# Patient Record
Sex: Male | Born: 1985 | Race: White | Hispanic: No | Marital: Single | Smoking: Never smoker
Health system: Southern US, Community
[De-identification: ages and names within clinical notes are randomized; demographics above are authoritative.]

## PROBLEM LIST (undated history)

## (undated) DIAGNOSIS — F419 Anxiety disorder, unspecified: Secondary | ICD-10-CM

## (undated) DIAGNOSIS — F909 Attention-deficit hyperactivity disorder, unspecified type: Secondary | ICD-10-CM

## (undated) HISTORY — DX: Attention-deficit hyperactivity disorder, unspecified type: F90.9

## (undated) HISTORY — PX: OTHER SURGICAL HISTORY: SHX169

## (undated) HISTORY — DX: Anxiety disorder, unspecified: F41.9

---
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
## 2008-09-08 ENCOUNTER — Ambulatory Visit: Payer: Self-pay

## 2012-06-18 ENCOUNTER — Emergency Department: Payer: Self-pay | Admitting: Emergency Medicine

## 2013-09-25 ENCOUNTER — Emergency Department: Payer: Self-pay | Admitting: Emergency Medicine

## 2013-09-25 LAB — BEHAVIORAL MEDICINE 1 PANEL
AST: 37 U/L (ref 15–37)
Albumin: 4.4 g/dL (ref 3.4–5.0)
Alkaline Phosphatase: 81 U/L
Anion Gap: 2 — ABNORMAL LOW (ref 7–16)
BASOS ABS: 0.1 10*3/uL (ref 0.0–0.1)
BUN: 13 mg/dL (ref 7–18)
Basophil %: 0.8 %
Bilirubin,Total: 0.6 mg/dL (ref 0.2–1.0)
CO2: 31 mmol/L (ref 21–32)
Calcium, Total: 9.7 mg/dL (ref 8.5–10.1)
Chloride: 103 mmol/L (ref 98–107)
Creatinine: 1.1 mg/dL (ref 0.60–1.30)
EOS ABS: 0.3 10*3/uL (ref 0.0–0.7)
EOS PCT: 5.7 %
Glucose: 75 mg/dL (ref 65–99)
HCT: 47.7 % (ref 40.0–52.0)
HGB: 17.1 g/dL (ref 13.0–18.0)
Lymphocyte #: 1.5 10*3/uL (ref 1.0–3.6)
Lymphocyte %: 25.5 %
MCH: 32 pg (ref 26.0–34.0)
MCHC: 35.8 g/dL (ref 32.0–36.0)
MCV: 89 fL (ref 80–100)
MONO ABS: 0.7 x10 3/mm (ref 0.2–1.0)
Monocyte %: 12 %
NEUTROS ABS: 3.4 10*3/uL (ref 1.4–6.5)
Neutrophil %: 56 %
Osmolality: 271 (ref 275–301)
PLATELETS: 158 10*3/uL (ref 150–440)
Potassium: 4.1 mmol/L (ref 3.5–5.1)
RBC: 5.34 10*6/uL (ref 4.40–5.90)
RDW: 12.7 % (ref 11.5–14.5)
SGPT (ALT): 32 U/L (ref 12–78)
Sodium: 136 mmol/L (ref 136–145)
Thyroid Stimulating Horm: 1.5 u[IU]/mL
Total Protein: 8.4 g/dL — ABNORMAL HIGH (ref 6.4–8.2)
WBC: 6 10*3/uL (ref 3.8–10.6)

## 2013-09-25 LAB — DRUG SCREEN, URINE
Amphetamines, Ur Screen: POSITIVE (ref ?–1000)
BARBITURATES, UR SCREEN: NEGATIVE (ref ?–200)
Benzodiazepine, Ur Scrn: NEGATIVE (ref ?–200)
Cannabinoid 50 Ng, Ur ~~LOC~~: NEGATIVE (ref ?–50)
Cocaine Metabolite,Ur ~~LOC~~: NEGATIVE (ref ?–300)
MDMA (Ecstasy)Ur Screen: NEGATIVE (ref ?–500)
Methadone, Ur Screen: NEGATIVE (ref ?–300)
OPIATE, UR SCREEN: NEGATIVE (ref ?–300)
Phencyclidine (PCP) Ur S: NEGATIVE (ref ?–25)
TRICYCLIC, UR SCREEN: NEGATIVE (ref ?–1000)

## 2015-04-11 ENCOUNTER — Encounter: Payer: Self-pay | Admitting: Family Medicine

## 2015-04-11 ENCOUNTER — Ambulatory Visit (INDEPENDENT_AMBULATORY_CARE_PROVIDER_SITE_OTHER): Payer: BLUE CROSS/BLUE SHIELD | Admitting: Family Medicine

## 2015-04-11 VITALS — BP 130/80 | HR 78 | Ht 71.0 in | Wt 203.0 lb

## 2015-04-11 DIAGNOSIS — F419 Anxiety disorder, unspecified: Secondary | ICD-10-CM | POA: Diagnosis not present

## 2015-04-11 DIAGNOSIS — F900 Attention-deficit hyperactivity disorder, predominantly inattentive type: Secondary | ICD-10-CM

## 2015-04-11 MED ORDER — SERTRALINE HCL 25 MG PO TABS: 25.0000 mg | ORAL_TABLET | Freq: Every day | ORAL | Status: DC

## 2015-04-11 NOTE — Progress Notes (Signed)
Name: David Graham   MRN: [id number]    DOB: Oct 02, 1985   Date:04/11/2015       Progress Note  Subjective  Chief Complaint  Chief Complaint  Patient presents with  . Depression    Anxiety Presents for follow-up visit. Onset was 1 to 6 months ago. The problem has been waxing and waning. Symptoms include depressed mood, excessive worry, irritability, nervous/anxious behavior and panic. Patient reports no chest pain, compulsions, confusion, decreased concentration, dizziness, dry mouth, feeling of choking, hyperventilation, impotence, insomnia, malaise, muscle tension, nausea, obsessions, palpitations, restlessness, shortness of breath or suicidal ideas. Symptoms occur occasionally. The severity of symptoms is mild. Nothing aggravates the symptoms.   His past medical history is significant for anxiety/panic attacks. There is no history of anemia, arrhythmia, asthma, bipolar disorder, CAD, CHF, chronic lung disease, depression, fibromyalgia, hyperthyroidism or suicide attempts. Past treatments include benzodiazephines. The treatment provided mild relief. Compliance with prior treatments has been good.    No problem-specific assessment & plan notes found for this encounter.   Past Medical History  Diagnosis Date  . Anxiety   . ADHD (attention deficit hyperactivity disorder)     History reviewed. No pertinent past surgical history.  History reviewed. No pertinent family history.  History   Social History  . Marital Status: Single    Spouse Name: N/A  . Number of Children: N/A  . Years of Education: N/A   Occupational History  . Not on file.   Social History Main Topics  . Smoking status: Never Smoker   . Smokeless tobacco: Current User    Types: Snuff     Comment: occassional  . Alcohol Use: 0.0 oz/week    0 Standard drinks or equivalent per week  . Drug Use: No  . Sexual Activity: Not Currently   Other Topics Concern  . Not on file   Social History Narrative  . No  narrative on file    No Known Allergies   Review of Systems  Constitutional: Positive for irritability.  Respiratory: Negative for shortness of breath.   Cardiovascular: Negative for chest pain and palpitations.  Gastrointestinal: Negative for nausea.  Genitourinary: Negative for impotence.  Neurological: Negative for dizziness.  Psychiatric/Behavioral: Negative for suicidal ideas, confusion and decreased concentration. The patient is nervous/anxious. The patient does not have insomnia.      Objective  Filed Vitals:   04/11/15 1359  BP: 130/80  Pulse: 78  Height:  (1.803 m)  Weight: 203 lb (92.08 kg)    Physical Exam    Assessment & Plan  Problem List Items Addressed This Visit    None    Visit Diagnoses    Acute anxiety    -  Primary    to contact behavior med for earlier followup    Relevant Medications    LORazepam (ATIVAN) 0.5 MG tablet    sertraline (ZOLOFT) 25 MG tablet    Attention deficit hyperactivity disorder (ADHD), predominantly inattentive type             Dr. Hayden [facility]  04/11/2015

## 2015-10-21 ENCOUNTER — Other Ambulatory Visit: Payer: Self-pay | Admitting: Family Medicine

## 2016-02-09 ENCOUNTER — Other Ambulatory Visit: Payer: Self-pay | Admitting: Family Medicine

## 2016-10-10 ENCOUNTER — Ambulatory Visit: Payer: BLUE CROSS/BLUE SHIELD | Admitting: Family Medicine

## 2017-02-27 ENCOUNTER — Encounter: Payer: Self-pay | Admitting: Family Medicine

## 2017-05-06 ENCOUNTER — Encounter: Payer: Self-pay | Admitting: Family Medicine

## 2017-08-29 ENCOUNTER — Encounter: Payer: Self-pay | Admitting: Family Medicine

## 2017-08-29 ENCOUNTER — Ambulatory Visit: Payer: 59 | Admitting: Family Medicine

## 2017-08-29 VITALS — BP 138/84 | HR 77 | Ht 71.0 in | Wt 208.0 lb

## 2017-08-29 DIAGNOSIS — F4323 Adjustment disorder with mixed anxiety and depressed mood: Secondary | ICD-10-CM

## 2017-08-29 DIAGNOSIS — R5383 Other fatigue: Secondary | ICD-10-CM

## 2017-08-29 DIAGNOSIS — R5381 Other malaise: Secondary | ICD-10-CM

## 2017-08-29 DIAGNOSIS — R030 Elevated blood-pressure reading, without diagnosis of hypertension: Secondary | ICD-10-CM

## 2017-08-29 NOTE — Patient Instructions (Signed)
DASH Eating Plan DASH stands for "Dietary Approaches to Stop Hypertension." The DASH eating plan is a healthy eating plan that has been shown to reduce high blood pressure (hypertension). It may also reduce your risk for type 2 diabetes, heart disease, and stroke. The DASH eating plan may also help with weight loss. What are tips for following this plan? General guidelines  Avoid eating more than 2,300 mg (milligrams) of salt (sodium) a day. If you have hypertension, you may need to reduce your sodium intake to 1,500 mg a day.  Limit alcohol intake to no more than 1 drink a day for nonpregnant women and 2 drinks a day for men. One drink equals 12 oz of beer, 5 oz of wine, or 1 oz of hard liquor.  Work with your health care provider to maintain a healthy body weight or to lose weight. Ask what an ideal weight is for you.  Get at least 30 minutes of exercise that causes your heart to beat faster (aerobic exercise) most days of the week. Activities may include walking, swimming, or biking.  Work with your health care provider or diet and nutrition specialist (dietitian) to adjust your eating plan to your individual calorie needs. Reading food labels  Check food labels for the amount of sodium per serving. Choose foods with less than 5 percent of the Daily Value of sodium. Generally, foods with less than 300 mg of sodium per serving fit into this eating plan.  To find whole grains, look for the word "whole" as the first word in the ingredient list. Shopping  Buy products labeled as "low-sodium" or "no salt added."  Buy fresh foods. Avoid canned foods and premade or frozen meals. Cooking  Avoid adding salt when cooking. Use salt-free seasonings or herbs instead of table salt or sea salt. Check with your health care provider or pharmacist before using salt substitutes.  Do not fry foods. Cook foods using healthy methods such as baking, boiling, grilling, and broiling instead.  Cook with  heart-healthy oils, such as olive, canola, soybean, or sunflower oil. Meal planning   Eat a balanced diet that includes: ? 5 or more servings of fruits and vegetables each day. At each meal, try to fill half of your plate with fruits and vegetables. ? Up to 6-8 servings of whole grains each day. ? Less than 6 oz of lean meat, poultry, or fish each day. A 3-oz serving of meat is about the same size as a deck of cards. One egg equals 1 oz. ? 2 servings of low-fat dairy each day. ? A serving of nuts, seeds, or beans 5 times each week. ? Heart-healthy fats. Healthy fats called Omega-3 fatty acids are found in foods such as flaxseeds and coldwater fish, like sardines, salmon, and mackerel.  Limit how much you eat of the following: ? Canned or prepackaged foods. ? Food that is high in trans fat, such as fried foods. ? Food that is high in saturated fat, such as fatty meat. ? Sweets, desserts, sugary drinks, and other foods with added sugar. ? Full-fat dairy products.  Do not salt foods before eating.  Try to eat at least 2 vegetarian meals each week.  Eat more home-cooked food and less restaurant, buffet, and fast food.  When eating at a restaurant, ask that your food be prepared with less salt or no salt, if possible. What foods are recommended? The items listed may not be a complete list. Talk with your dietitian about what   dietary choices are best for you. Grains Whole-grain or whole-wheat bread. Whole-grain or whole-wheat pasta. Brown rice. Oatmeal. Quinoa. Bulgur. Whole-grain and low-sodium cereals. Pita bread. Low-fat, low-sodium crackers. Whole-wheat flour tortillas. Vegetables Fresh or frozen vegetables (raw, steamed, roasted, or grilled). Low-sodium or reduced-sodium tomato and vegetable juice. Low-sodium or reduced-sodium tomato sauce and tomato paste. Low-sodium or reduced-sodium canned vegetables. Fruits All fresh, dried, or frozen fruit. Canned fruit in natural juice (without  added sugar). Meat and other protein foods Skinless chicken or turkey. Ground chicken or turkey. Pork with fat trimmed off. Fish and seafood. Egg whites. Dried beans, peas, or lentils. Unsalted nuts, nut butters, and seeds. Unsalted canned beans. Lean cuts of beef with fat trimmed off. Low-sodium, lean deli meat. Dairy Low-fat (1%) or fat-free (skim) milk. Fat-free, low-fat, or reduced-fat cheeses. Nonfat, low-sodium ricotta or cottage cheese. Low-fat or nonfat yogurt. Low-fat, low-sodium cheese. Fats and oils Soft margarine without trans fats. Vegetable oil. Low-fat, reduced-fat, or light mayonnaise and salad dressings (reduced-sodium). Canola, safflower, olive, soybean, and sunflower oils. Avocado. Seasoning and other foods Herbs. Spices. Seasoning mixes without salt. Unsalted popcorn and pretzels. Fat-free sweets. What foods are not recommended? The items listed may not be a complete list. Talk with your dietitian about what dietary choices are best for you. Grains Baked goods made with fat, such as croissants, muffins, or some breads. Dry pasta or rice meal packs. Vegetables Creamed or fried vegetables. Vegetables in a cheese sauce. Regular canned vegetables (not low-sodium or reduced-sodium). Regular canned tomato sauce and paste (not low-sodium or reduced-sodium). Regular tomato and vegetable juice (not low-sodium or reduced-sodium). Pickles. Olives. Fruits Canned fruit in a light or heavy syrup. Fried fruit. Fruit in cream or butter sauce. Meat and other protein foods Fatty cuts of meat. Ribs. Fried meat. Bacon. Sausage. Bologna and other processed lunch meats. Salami. Fatback. Hotdogs. Bratwurst. Salted nuts and seeds. Canned beans with added salt. Canned or smoked fish. Whole eggs or egg yolks. Chicken or turkey with skin. Dairy Whole or 2% milk, cream, and half-and-half. Whole or full-fat cream cheese. Whole-fat or sweetened yogurt. Full-fat cheese. Nondairy creamers. Whipped toppings.  Processed cheese and cheese spreads. Fats and oils Butter. Stick margarine. Lard. Shortening. Ghee. Bacon fat. Tropical oils, such as coconut, palm kernel, or palm oil. Seasoning and other foods Salted popcorn and pretzels. Onion salt, garlic salt, seasoned salt, table salt, and sea salt. Worcestershire sauce. Tartar sauce. Barbecue sauce. Teriyaki sauce. Soy sauce, including reduced-sodium. Steak sauce. Canned and packaged gravies. Fish sauce. Oyster sauce. Cocktail sauce. Horseradish that you find on the shelf. Ketchup. Mustard. Meat flavorings and tenderizers. Bouillon cubes. Hot sauce and Tabasco sauce. Premade or packaged marinades. Premade or packaged taco seasonings. Relishes. Regular salad dressings. Where to find more information:  National Heart, Lung, and Blood Institute: www.nhlbi.nih.gov  American Heart Association: www.heart.org Summary  The DASH eating plan is a healthy eating plan that has been shown to reduce high blood pressure (hypertension). It may also reduce your risk for type 2 diabetes, heart disease, and stroke.  With the DASH eating plan, you should limit salt (sodium) intake to 2,300 mg a day. If you have hypertension, you may need to reduce your sodium intake to 1,500 mg a day.  When on the DASH eating plan, aim to eat more fresh fruits and vegetables, whole grains, lean proteins, low-fat dairy, and heart-healthy fats.  Work with your health care provider or diet and nutrition specialist (dietitian) to adjust your eating plan to your individual   calorie needs. This information is not intended to replace advice given to you by your health care provider. Make sure you discuss any questions you have with your health care provider. Document Released: 08/23/2011 Document Revised: 08/27/2016 Document Reviewed: 08/27/2016 Elsevier Interactive Patient Education  2017 Elsevier Inc.  

## 2017-08-29 NOTE — Progress Notes (Signed)
Name: David Graham   MRN: [id number]    DOB: 10/20/1985   Date:08/29/2017       Progress Note  Subjective  Chief Complaint  Chief Complaint  Patient presents with  . Hypertension    Having dull headaches. Lots of stress- high bp reading at [employer] less than a week ago. At [employer] 170-100. Is having a lot of stress.      Patient "stay tired"   Hypertension  This is a new problem. The current episode started 1 to 4 weeks ago. The problem has been waxing and waning since onset. Pertinent negatives include no anxiety, blurred vision, chest pain, headaches, malaise/fatigue, neck pain, orthopnea, palpitations, peripheral edema, PND, shortness of breath or sweats. There are no associated agents to hypertension. There are no known risk factors for coronary artery disease. Past treatments include lifestyle changes. The current treatment provides no improvement. There are no compliance problems.  There is no history of angina, kidney disease, CAD/MI, CVA, heart failure, left ventricular hypertrophy, PVD or retinopathy. There is no history of chronic renal disease, a hypertension causing med or renovascular disease.    No problem-specific Assessment & Plan notes found for this encounter.   Past Medical History:  Diagnosis Date  . ADHD (attention deficit hyperactivity disorder)   . Anxiety     History reviewed. No pertinent surgical history.  History reviewed. No pertinent family history.  Social History   Socioeconomic History  . Marital status: Single    Spouse name: Not on file  . Number of children: Not on file  . Years of education: Not on file  . Highest education level: Not on file  Social Needs  . Financial resource strain: Not on file  . Food insecurity - worry: Not on file  . Food insecurity - inability: Not on file  . Transportation needs - medical: Not on file  . Transportation needs - non-medical: Not on file  Occupational History  . Not on file  Tobacco Use  .  Smoking status: Never Smoker  . Smokeless tobacco: Current User    Types: Snuff  . Tobacco comment: occassional  Substance and Sexual Activity  . Alcohol use: Yes    Alcohol/week: 0.0 oz  . Drug use: No  . Sexual activity: Not Currently  Other Topics Concern  . Not on file  Social History Narrative  . Not on file    No Known Allergies  Outpatient Medications Prior to Visit  Medication Sig Dispense Refill  . clonazePAM (KLONOPIN) 0.5 MG tablet Take 0.5 mg by mouth 2 (two) times daily as needed for anxiety.    . sertraline (ZOLOFT) 25 MG tablet TAKE ONE TABLET BY MOUTH ONCE DAILY 15 tablet 0  . amphetamine-dextroamphetamine (ADDERALL XR) 30 MG 24 hr capsule Take 30 mg by mouth daily. Dr Jennelle Humanottle    . LORazepam (ATIVAN) 0.5 MG tablet Take 0.5 mg by mouth every 8 (eight) hours. Dr Jennelle Humanottle     No facility-administered medications prior to visit.     Review of Systems  Constitutional: Negative for malaise/fatigue.  Eyes: Negative for blurred vision.  Respiratory: Negative for shortness of breath.   Cardiovascular: Negative for chest pain, palpitations, orthopnea and PND.  Musculoskeletal: Negative for neck pain.  Neurological: Negative for headaches.     Objective  Vitals:   08/29/17 1346  BP: 138/84  Pulse: 77  SpO2: 100%  Weight: 208 lb (94.3 kg)  Height: 5\' 11"  (1.803 m)    Physical Exam  Constitutional: He is oriented to person, place, and time and well-developed, well-nourished, and in no distress.  HENT:  Head: Normocephalic.  Right Ear: External ear normal.  Left Ear: External ear normal.  Nose: Nose normal.  Mouth/Throat: Oropharynx is clear and moist.  Eyes: Conjunctivae and EOM are normal. Pupils are equal, round, and reactive to light. Right eye exhibits no discharge. Left eye exhibits no discharge. No scleral icterus.  Neck: Normal range of motion. Neck supple. No JVD present. No tracheal deviation present. No thyromegaly present.  Cardiovascular: Normal  rate, regular rhythm, normal heart sounds and intact distal pulses. Exam reveals no gallop and no friction rub.  No murmur heard. Pulmonary/Chest: Breath sounds normal. No respiratory distress. He has no wheezes. He has no rales.  Abdominal: Soft. Bowel sounds are normal. He exhibits no mass. There is no hepatosplenomegaly. There is no tenderness. There is no rebound, no guarding and no CVA tenderness.  Musculoskeletal: Normal range of motion. He exhibits no edema or tenderness.  Lymphadenopathy:    He has no cervical adenopathy.  Neurological: He is alert and oriented to person, place, and time. He has normal sensation, normal strength, normal reflexes and intact cranial nerves. No cranial nerve deficit.  Skin: Skin is warm. No rash noted.  Psychiatric: Mood and affect normal.  Nursing note and vitals reviewed.     Assessment & Plan  Problem List Items Addressed This Visit    None    Visit Diagnoses    Elevated blood pressure reading without diagnosis of hypertension    -  Primary   Relevant Orders   Renal function panel   Malaise and fatigue       Relevant Orders   Renal function panel   TSH   Adjustment reaction with anxiety and depression          No orders of the defined types were placed in this encounter.     Dr. Hayden Rasmusseneanna Idella Lamontagne [facility]  08/29/17

## 2017-08-30 LAB — TSH: TSH: 1.68 u[IU]/mL (ref 0.450–4.500)

## 2017-08-30 LAB — RENAL FUNCTION PANEL
ALBUMIN: 4.9 g/dL (ref 3.5–5.5)
BUN/Creatinine Ratio: 10 (ref 9–20)
BUN: 12 mg/dL (ref 6–20)
CHLORIDE: 101 mmol/L (ref 96–106)
CO2: 26 mmol/L (ref 20–29)
Calcium: 9.9 mg/dL (ref 8.7–10.2)
Creatinine, Ser: 1.19 mg/dL (ref 0.76–1.27)
GFR, EST AFRICAN AMERICAN: 94 mL/min/{1.73_m2} (ref >59)
GFR, EST NON AFRICAN AMERICAN: 81 mL/min/{1.73_m2} (ref >59)
Glucose: 73 mg/dL (ref 65–99)
PHOSPHORUS: 4.3 mg/dL (ref 2.5–4.5)
Potassium: 4.3 mmol/L (ref 3.5–5.2)
Sodium: 142 mmol/L (ref 134–144)

## 2018-07-10 ENCOUNTER — Telehealth: Payer: Self-pay

## 2018-07-10 NOTE — Telephone Encounter (Signed)
LM to call us and schedule Follow Up for BP and Gaps in care BEFORE 09/10/2018 (See KPN ).

## 2018-08-03 ENCOUNTER — Encounter: Payer: Self-pay | Admitting: Emergency Medicine

## 2018-08-03 DIAGNOSIS — F39 Unspecified mood [affective] disorder: Secondary | ICD-10-CM | POA: Insufficient documentation

## 2018-08-03 DIAGNOSIS — F410 Panic disorder [episodic paroxysmal anxiety] without agoraphobia: Secondary | ICD-10-CM

## 2018-08-18 ENCOUNTER — Encounter: Payer: Self-pay | Admitting: Family Medicine

## 2018-08-19 ENCOUNTER — Encounter: Payer: Self-pay | Admitting: Psychiatry

## 2018-08-19 ENCOUNTER — Ambulatory Visit: Payer: 59 | Admitting: Psychiatry

## 2018-08-19 DIAGNOSIS — F4001 Agoraphobia with panic disorder: Secondary | ICD-10-CM | POA: Diagnosis not present

## 2018-08-19 DIAGNOSIS — F39 Unspecified mood [affective] disorder: Secondary | ICD-10-CM | POA: Diagnosis not present

## 2018-08-19 MED ORDER — CLONAZEPAM 0.5 MG PO TABS: 0.5000 mg | ORAL_TABLET | Freq: Two times a day (BID) | ORAL | 1 refills | Status: DC | PRN

## 2018-08-19 NOTE — Progress Notes (Signed)
David Graham [id number] 1986-07-22 32 y.o.  Subjective:   Patient ID:  David Graham is a 32 y.o. (DOB 1986-07-22) male.  Chief Complaint:  Chief Complaint  Patient presents with  . Anxiety  . Sleeping Problem  . Depression    HPI David Graham presents to the office today for follow-up of anxiety and sleep and recently more depressed.  Pt reports that mood is Anxious and Depressed and describes anxiety as Moderate. Anxiety symptoms include: Excessive Worry, not happy with personal life and work not good either.  Little stuff bothers him more.. Pt reports no sleep issues.  Too tired on doxepin but slept better.  Got a lot on his mind but not suffering from insomnia.  Pt reports that appetite is good. Pt reports that energy is good and loss of interest or pleasure in usual activities and poor motivation. Concentration is down slightly. Suicidal thoughts:  denied by patient.  More irritable and frutstrated with things.  Wants to avoid work.  Frustrated with dating life and being lonely and it never seems to make a difference.   Past psych med intolerances : Fluoxetine, Lexapro caused GI side effects, sertraline Trileptal caused a rash, Viibryd, Depakote.  Hx Adderall abuse.  Wellbutrin worst of all.  Review of Systems:  Review of Systems  Neurological: Negative for tremors and weakness.  Psychiatric/Behavioral: Positive for dysphoric mood. Negative for agitation, behavioral problems, confusion, decreased concentration, hallucinations, self-injury, sleep disturbance and suicidal ideas. The patient is nervous/anxious. The patient is not hyperactive.     Medications: I have reviewed the patient's current medications.  Current Outpatient Medications  Medication Sig Dispense Refill  . clonazePAM (KLONOPIN) 0.5 MG tablet Take 0.5 mg by mouth 2 (two) times daily as needed for anxiety.    Marland Kitchen. doxepin (SINEQUAN) 10 MG capsule Take 10 mg by mouth at bedtime. QHS X 7 DAYS, THEN 2 QHS    .  sertraline (ZOLOFT) 25 MG tablet TAKE ONE TABLET BY MOUTH ONCE DAILY (Patient not taking: Reported on 08/19/2018) 15 tablet 0   No current facility-administered medications for this visit.     Medication Side Effects: None Tends to have a lot of SE from meds.  Med sensitive.  Allergies:  Allergies  Allergen Reactions  . Oxcarbazepine   . Viibryd [Vilazodone Hcl]     Past Medical History:  Diagnosis Date  . ADHD (attention deficit hyperactivity disorder)   . Anxiety     History reviewed. No pertinent family history.  Social History   Socioeconomic History  . Marital status: Single    Spouse name: Not on file  . Number of children: Not on file  . Years of education: Not on file  . Highest education level: Not on file  Occupational History  . Not on file  Social Needs  . Financial resource strain: Not on file  . Food insecurity:    Worry: Not on file    Inability: Not on file  . Transportation needs:    Medical: Not on file    Non-medical: Not on file  Tobacco Use  . Smoking status: Never Smoker  . Smokeless tobacco: Current User    Types: Snuff  . Tobacco comment: occassional  Substance and Sexual Activity  . Alcohol use: Yes    Alcohol/week: 0.0 standard drinks  . Drug use: No  . Sexual activity: Not Currently  Lifestyle  . Physical activity:    Days per week: Not on file    Minutes  per session: Not on file  . Stress: Not on file  Relationships  . Social connections:    Talks on phone: Not on file    Gets together: Not on file    Attends religious service: Not on file    Active member of club or organization: Not on file    Attends meetings of clubs or organizations: Not on file    Relationship status: Not on file  . Intimate partner violence:    Fear of current or ex partner: Not on file    Emotionally abused: Not on file    Physically abused: Not on file    Forced sexual activity: Not on file  Other Topics Concern  . Not on file  Social History  Narrative  . Not on file    Past Medical History, Surgical history, Social history, and Family history were reviewed and updated as appropriate.   Please see review of systems for further details on the patient's review from today.   Objective:   Physical Exam:  There were no vitals taken for this visit.  Physical Exam  Constitutional: He is oriented to person, place, and time. He appears well-developed. No distress.  Musculoskeletal: He exhibits no deformity.  Neurological: He is alert and oriented to person, place, and time. He displays no tremor. Coordination and gait normal.  Psychiatric: He has a normal mood and affect. His speech is normal and behavior is normal. Judgment and thought content normal. His mood appears not anxious. His affect is not angry, not blunt, not labile and not inappropriate. Cognition and memory are normal. He does not exhibit a depressed mood. He expresses no homicidal and no suicidal ideation. He expresses no suicidal plans and no homicidal plans.  Insight intact. Dysphoric. No auditory or visual hallucinations. No delusions.  He is attentive.    Lab Review:     Component Value Date/Time   NA 142 08/29/2017 1444   NA 136 09/25/2013 1151   K 4.3 08/29/2017 1444   K 4.1 09/25/2013 1151   CL 101 08/29/2017 1444   CL 103 09/25/2013 1151   CO2 26 08/29/2017 1444   CO2 31 09/25/2013 1151   GLUCOSE 73 08/29/2017 1444   GLUCOSE 75 09/25/2013 1151   BUN 12 08/29/2017 1444   BUN 13 09/25/2013 1151   CREATININE 1.19 08/29/2017 1444   CREATININE 1.10 09/25/2013 1151   CALCIUM 9.9 08/29/2017 1444   CALCIUM 9.7 09/25/2013 1151   PROT 8.4 (H) 09/25/2013 1151   ALBUMIN 4.9 08/29/2017 1444   ALBUMIN 4.4 09/25/2013 1151   AST 37 09/25/2013 1151   ALT 32 09/25/2013 1151   ALKPHOS 81 09/25/2013 1151   BILITOT 0.6 09/25/2013 1151   GFRNONAA 81 08/29/2017 1444   GFRNONAA >60 09/25/2013 1151   GFRAA 94 08/29/2017 1444   GFRAA >60 09/25/2013 1151        Component Value Date/Time   WBC 6.0 09/25/2013 1151   RBC 5.34 09/25/2013 1151   HGB 17.1 09/25/2013 1151   HCT 47.7 09/25/2013 1151   PLT 158 09/25/2013 1151   MCV 89 09/25/2013 1151   MCH 32.0 09/25/2013 1151   MCHC 35.8 09/25/2013 1151   RDW 12.7 09/25/2013 1151   LYMPHSABS 1.5 09/25/2013 1151   MONOABS 0.7 09/25/2013 1151   EOSABS 0.3 09/25/2013 1151   BASOSABS 0.1 09/25/2013 1151    No results found for: POCLITH, LITHIUM   No results found for: PHENYTOIN, PHENOBARB, VALPROATE, CBMZ   .res Assessment:  Plan:    Panic disorder with agoraphobia  Episodic mood disorder (HCC)  Hx Adderall Abuse in remission  One type you've not taken is lithium even though not bipolar.  Low dosage option.  He doesn't want any change in meds.  We discussed the short-term risks associated with benzodiazepines including sedation and increased fall risk among others.  Discussed long-term side effect risk including dependence, potential withdrawal symptoms, and the potential eventual dose-related risk of dementia.   Problems with work but doesn't want to work elsewhere.  Tried dating sites without success.  I'm on th picky side.  Please see After Visit Summary for patient specific instructions.  This appointment was 15 minutes  Meredith Staggers, MD, DFAPA   No future appointments.  No orders of the defined types were placed in this encounter.     -------------------------------

## 2018-08-26 ENCOUNTER — Other Ambulatory Visit: Payer: Self-pay | Admitting: Psychiatry

## 2018-11-07 ENCOUNTER — Other Ambulatory Visit: Payer: Self-pay | Admitting: Psychiatry

## 2018-11-07 ENCOUNTER — Telehealth: Payer: Self-pay | Admitting: Psychiatry

## 2018-11-07 MED ORDER — SERTRALINE HCL 20 MG/ML PO CONC: 20.0000 mg | Freq: Every day | ORAL | 0 refills | Status: DC

## 2018-11-07 NOTE — Telephone Encounter (Signed)
Patient has taken sertraline suspension 20 mg/mL but only a drop or 2 at a time.  In 2018 August he said that it was helpful but reduced his energy and he had mild sexual side effects.  He also complained planed of mild mental cloudiness.  However he is wanting to take the medication again.  He is extremely medication sensitive.  But over time some of these patients become a little more tolerant as they take the medication.  We will call in a prescription that says 1 mL/day but he should restart at a low dose of a few drops per day as he has done in the past.  If he needs further instructions let us know.  Prescription was sent in to [facility] [place].  Meredith Staggers, MD, DFAPA

## 2018-11-07 NOTE — Telephone Encounter (Signed)
Patient has called and said that he does want to take the zoloft liquid drops. [facility] [place]. He was just here but if he needs to come in he will. Please let him know [phone].

## 2018-11-07 NOTE — Telephone Encounter (Signed)
Pt aware and verbalized understanding. Instructed to call back with any questions or concerns.

## 2019-01-05 ENCOUNTER — Other Ambulatory Visit: Payer: Self-pay | Admitting: Psychiatry

## 2019-01-05 ENCOUNTER — Telehealth: Payer: Self-pay | Admitting: Psychiatry

## 2019-01-05 NOTE — Telephone Encounter (Signed)
Patient called and said that the klonopin is no longer working. His anxiety is off the chart. He doesn;t know whether to change the dosage of klonopin or change medicine all together. [facility] [place]. Please call him asap since he is not doing well at [phone]

## 2019-01-05 NOTE — Telephone Encounter (Signed)
He has been on the sertraline solution 20 mg/mL at 1 mL a day for about 8 weeks now.  I understand he is medication sensitive but the longer he takes the medicine the better he will tend to tolerate it and it will help if he goes up in the dosage slightly to 1.2 mL's daily.  In addition to that he can increase the clonazepam by 50% to 0.5 mg 1 tablet 3 times a day.  That should probably provide a noticeable change.  He probably should also move his appointment up with me.

## 2019-01-06 ENCOUNTER — Other Ambulatory Visit: Payer: Self-pay | Admitting: Psychiatry

## 2019-01-06 ENCOUNTER — Telehealth: Payer: Self-pay | Admitting: Psychiatry

## 2019-01-06 NOTE — Telephone Encounter (Signed)
Pt given information, needs to discuss with Dr. Jennelle Human. Scheduled tomorrow 04/22

## 2019-01-06 NOTE — Telephone Encounter (Signed)
Left pt. A VM on his personal phone.

## 2019-01-06 NOTE — Telephone Encounter (Signed)
Please review

## 2019-01-06 NOTE — Telephone Encounter (Signed)
Patient would like to stay on regiman for Colonapin and Serlatine drops he is due for a refill for Colonapin on tomorrow.   Patient on schedule with Dr. Jennelle Human at 2:30 tomorrow

## 2019-01-06 NOTE — Telephone Encounter (Signed)
Wait till I see him tomorrow.  No refill today except I did send in the sertraline

## 2019-01-07 ENCOUNTER — Ambulatory Visit (INDEPENDENT_AMBULATORY_CARE_PROVIDER_SITE_OTHER): Payer: 59 | Admitting: Psychiatry

## 2019-01-07 ENCOUNTER — Other Ambulatory Visit: Payer: Self-pay

## 2019-01-07 ENCOUNTER — Encounter: Payer: Self-pay | Admitting: Psychiatry

## 2019-01-07 DIAGNOSIS — Z8659 Personal history of other mental and behavioral disorders: Secondary | ICD-10-CM | POA: Diagnosis not present

## 2019-01-07 DIAGNOSIS — F4001 Agoraphobia with panic disorder: Secondary | ICD-10-CM

## 2019-01-07 DIAGNOSIS — F411 Generalized anxiety disorder: Secondary | ICD-10-CM | POA: Diagnosis not present

## 2019-01-07 DIAGNOSIS — F15929 Other stimulant use, unspecified with intoxication, unspecified: Secondary | ICD-10-CM | POA: Diagnosis not present

## 2019-01-07 MED ORDER — LORAZEPAM 1 MG PO TABS: 1.0000 mg | ORAL_TABLET | Freq: Four times a day (QID) | ORAL | 0 refills | Status: DC

## 2019-01-07 NOTE — Progress Notes (Signed)
David Graham [id number] Jun 19, 1986 33 y.o.   Virtual Visit via Telephone Note  I connected with pt by telephone and verified that I am speaking with the correct person using two identifiers.   I discussed the limitations, risks, security and privacy concerns of performing an evaluation and management service by telephone and the availability of in person appointments. I also discussed with the patient that there may be a patient responsible charge related to this service. The patient expressed understanding and agreed to proceed.  I discussed the assessment and treatment plan with the patient. The patient was provided an opportunity to ask questions and all were answered. The patient agreed with the plan and demonstrated an understanding of the instructions.   The patient was advised to call back or seek an in-person evaluation if the symptoms worsen or if the condition fails to improve as anticipated.  I provided 36 minutes of non-face-to-face time during this encounter. The call started at 218 and ended at 35. The patient was located at home and the provider was located also office.   Subjective:   Patient ID:  David Graham is a 33 y.o. (DOB [birth year]) male.  Chief Complaint:  Chief Complaint  Patient presents with  . Anxiety    Urgent appointment due to severe intolerable anxiety  . Follow-up    Medication management  . Medication Problem    Very medication sensitive and fearful of meds    HPI  David Graham  requested urgent appointment for intolerable anxiety.  He called over the last couple of days wanting med changes and the clonazepam was increased to 0.5 3 times daily from twice daily.  It was encouraged for him to increase the sertraline but he was reluctant to do so.  2-3 weeks worsening anxiety, trouble sleeping, lower appetite, lost 10#.  Meds not working.  Wants relief.  No known pcpt.  Started taking a few more drops of sertraline to see if it would help.  It  has been this bad before also.  Reason he hasn't taken much sertraline is sexual se and cloudiness.  Had tried fluoxetine and didn't like it bc made him feel weird.  I feel like I'm immune to clonazepam.  Increase in clonazepam to TID helped a little.  Not taking doxepin bc hangover. Has taken sertraline solution off and on 4-6 drops on occasion.  On verge of panic. Dating a new girl and that's generating anxiety.  Anxiety all day long.  Admits to drinking or taking too much caffeine.  He uses it as a pre-workout but then will also drink it at night.  He has energy drinks in the daytime.  He is not even sure how much she is drinking but says it is a lot.  Dating a new girl but does not think that is the total cause of his anxiety.  Past psych med intolerances : Fluoxetine, Lexapro caused GI side effects, sertraline,  Trileptal caused a rash, Viibryd, Depakote.  Hx Adderall abuse.  Wellbutrin worst of all.  Xanax and Ativan.  Review of Systems:  Review of Systems  Neurological: Negative for tremors and weakness.  Psychiatric/Behavioral: Positive for dysphoric mood. Negative for agitation, behavioral problems, confusion, decreased concentration, hallucinations, self-injury, sleep disturbance and suicidal ideas. The patient is nervous/anxious. The patient is not hyperactive.     Medications: I have reviewed the patient's current medications.  Current Outpatient Medications  Medication Sig Dispense Refill  . clonazePAM (KLONOPIN) 0.5 MG tablet Take  1 tablet (0.5 mg total) by mouth 2 (two) times daily as needed for anxiety. (Patient taking differently: Take 0.5 mg by mouth 3 (three) times daily as needed for anxiety. ) 180 tablet 1  . LORazepam (ATIVAN) 1 MG tablet Take 1 tablet (1 mg total) by mouth 4 (four) times daily. 120 tablet 0  . sertraline (ZOLOFT) 20 MG/ML concentrated solution Take 1.5 mLs (30 mg total) by mouth daily. 45 mL 0  . sertraline (ZOLOFT) 25 MG tablet TAKE ONE TABLET BY MOUTH  ONCE DAILY (Patient not taking: Reported on 08/19/2018) 15 tablet 0   No current facility-administered medications for this visit.     Medication Side Effects: None Tends to have a lot of SE from meds.  Med sensitive.  Allergies:  Allergies  Allergen Reactions  . Oxcarbazepine   . Viibryd [Vilazodone Hcl]     Past Medical History:  Diagnosis Date  . ADHD (attention deficit hyperactivity disorder)   . Anxiety     No family history on file.  Social History   Socioeconomic History  . Marital status: Single    Spouse name: Not on file  . Number of children: Not on file  . Years of education: Not on file  . Highest education level: Not on file  Occupational History  . Not on file  Social Needs  . Financial resource strain: Not on file  . Food insecurity:    Worry: Not on file    Inability: Not on file  . Transportation needs:    Medical: Not on file    Non-medical: Not on file  Tobacco Use  . Smoking status: Never Smoker  . Smokeless tobacco: Current User    Types: Snuff  . Tobacco comment: occassional  Substance and Sexual Activity  . Alcohol use: Yes    Alcohol/week: 0.0 standard drinks  . Drug use: No  . Sexual activity: Not Currently  Lifestyle  . Physical activity:    Days per week: Not on file    Minutes per session: Not on file  . Stress: Not on file  Relationships  . Social connections:    Talks on phone: Not on file    Gets together: Not on file    Attends religious service: Not on file    Active member of club or organization: Not on file    Attends meetings of clubs or organizations: Not on file    Relationship status: Not on file  . Intimate partner violence:    Fear of current or ex partner: Not on file    Emotionally abused: Not on file    Physically abused: Not on file    Forced sexual activity: Not on file  Other Topics Concern  . Not on file  Social History Narrative  . Not on file    Past Medical History, Surgical history, Social  history, and Family history were reviewed and updated as appropriate.   Please see review of systems for further details on the patient's review from today.   Objective:   Physical Exam:  There were no vitals taken for this visit.  Physical Exam Neurological:     Mental Status: He is alert and oriented to person, place, and time.     Cranial Nerves: No dysarthria.  Psychiatric:        Attention and Perception: Attention normal.        Mood and Affect: Mood is anxious. Mood is not depressed.  Speech: Speech normal.        Behavior: Behavior is cooperative.        Thought Content: Thought content normal. Thought content is not paranoid or delusional. Thought content does not include homicidal or suicidal ideation. Thought content does not include homicidal or suicidal plan.        Cognition and Memory: Cognition and memory normal.     Comments: Severe anxiety.  Insight is fair and judgment fair.     Lab Review:     Component Value Date/Time   NA 142 08/29/2017 1444   NA 136 09/25/2013 1151   K 4.3 08/29/2017 1444   K 4.1 09/25/2013 1151   CL 101 08/29/2017 1444   CL 103 09/25/2013 1151   CO2 26 08/29/2017 1444   CO2 31 09/25/2013 1151   GLUCOSE 73 08/29/2017 1444   GLUCOSE 75 09/25/2013 1151   BUN 12 08/29/2017 1444   BUN 13 09/25/2013 1151   CREATININE 1.19 08/29/2017 1444   CREATININE 1.10 09/25/2013 1151   CALCIUM 9.9 08/29/2017 1444   CALCIUM 9.7 09/25/2013 1151   PROT 8.4 (H) 09/25/2013 1151   ALBUMIN 4.9 08/29/2017 1444   ALBUMIN 4.4 09/25/2013 1151   AST 37 09/25/2013 1151   ALT 32 09/25/2013 1151   ALKPHOS 81 09/25/2013 1151   BILITOT 0.6 09/25/2013 1151   GFRNONAA 81 08/29/2017 1444   GFRNONAA >60 09/25/2013 1151   GFRAA 94 08/29/2017 1444   GFRAA >60 09/25/2013 1151       Component Value Date/Time   WBC 6.0 09/25/2013 1151   RBC 5.34 09/25/2013 1151   HGB 17.1 09/25/2013 1151   HCT 47.7 09/25/2013 1151   PLT 158 09/25/2013 1151   MCV 89  09/25/2013 1151   MCH 32.0 09/25/2013 1151   MCHC 35.8 09/25/2013 1151   RDW 12.7 09/25/2013 1151   LYMPHSABS 1.5 09/25/2013 1151   MONOABS 0.7 09/25/2013 1151   EOSABS 0.3 09/25/2013 1151   BASOSABS 0.1 09/25/2013 1151    No results found for: POCLITH, LITHIUM   No results found for: PHENYTOIN, PHENOBARB, VALPROATE, CBMZ   .res Assessment: Plan:    Generalized anxiety disorder - Plan: LORazepam (ATIVAN) 1 MG tablet  Panic disorder with agoraphobia - Plan: LORazepam (ATIVAN) 1 MG tablet  History of major depression  Caffeine intoxication with complication (HCC)  Hx Adderall Abuse in remission  I am quite certain that a significant part of his acute worsening of anxiety is related to caffeine excess.  Patient's that have a history of panic typically are not very tolerant of caffeine and he admits to drinking or consuming too much caffeine.   Redcuce the caffeine by 20% daily until off .  Discussed caffeine withdrawal headaches.  We discussed the short-term risks associated with benzodiazepines including sedation and increased fall risk among others.  Discussed long-term side effect risk including tolerance, dependence, potential withdrawal symptoms, and the potential eventual dose-related risk of dementia.  Switch  To Ativan 1 mg QID.    Again discussed the various ways to prevent anxiety and SSRI are SSRIs are the most effective.  As noted he is tried several and prefers Zoloft.  He is willing to take it and stick with it for a while this time.  He is very med sensitive to SSRIs so we will need to use a low dose.  He has the sertraline solution 20 mg/mL get 1ml syringe , Start with .25ml  For 5-7 days, then 0.5 ml.  BC very med sensitive..  Disc SE in detail and be patient.  Goal remission.  Call if a problem.  He is med sensitive it is conceivable he might need an even lower dose and if he has a problem we can lower the dose and he is likely to still get benefit if he will be  consistent.  There is a history of compliance problems but no evidence of abuse of benzodiazepines.  He does have a past history of abusing Adderall.  Recommend counseling for anxiety.  Not done seriously before.  Will refer to Dr. Doran Heater.  Follow-up 4 weeks  Meredith Staggers, MD, DFAPA   Future Appointments  Date Time Provider Department Center  02/05/2019  3:00 PM Cottle, Steva Ready., MD [facility] None  02/17/2019  4:00 PM Cottle, Steva Ready., MD [facility] None    No orders of the defined types were placed in this encounter.     -------------------------------

## 2019-01-09 ENCOUNTER — Telehealth: Payer: Self-pay | Admitting: Psychiatry

## 2019-01-09 NOTE — Telephone Encounter (Signed)
Patient called and said that the ativan is not working and he wants to go back to the klonopin or try something different. Please call him at [phone] and [facility] [place]

## 2019-01-12 ENCOUNTER — Telehealth: Payer: Self-pay | Admitting: Psychiatry

## 2019-01-12 NOTE — Telephone Encounter (Signed)
Pt. Understands and will return Ativan at the end of this week. He would like to try the Risperidone.

## 2019-01-12 NOTE — Telephone Encounter (Signed)
I just gave him a month supply of Ativan last week.  He will need to bring the Ativan to the office before I will prescribe any more Klonopin.  If he has some Klonopin left he can go back to 3 daily.  I could prescribe something a little stronger like low dosage risperidone 0.5 mg he could take 1 at night that tends to be more helpful for obsessive worry than do meds like Klonopin or Ativan.  Eventually the sertraline will work if he will stick with it.  But it is also absolutely critical that he reduce the caffeine intake that he described to me last week.  Nothing will work if he does not reduce the caffeine.

## 2019-01-12 NOTE — Telephone Encounter (Signed)
Noted  

## 2019-01-12 NOTE — Telephone Encounter (Signed)
Pt called again. Stated he would like to stop Ativan. He feels out of it. Said he should have listen to you about increasing Klonopin to 3 in lieu of 2. Would like to try that now.

## 2019-01-12 NOTE — Telephone Encounter (Signed)
Pt aware and will return Ativan at the end of the week to the office.

## 2019-01-12 NOTE — Telephone Encounter (Signed)
David Graham called back and said he was going to try to cut the Ativan in half and see if that works.  He will try that for a week or so, if if doesn't work he'll bring it back and ask for something new.  He has appt 02/05/19 so wants to see if he can make it til then.

## 2019-01-16 ENCOUNTER — Telehealth: Payer: Self-pay | Admitting: Psychiatry
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
## 2019-01-19 ENCOUNTER — Telehealth: Payer: Self-pay | Admitting: Psychiatry

## 2019-01-19 NOTE — Telephone Encounter (Signed)
He calls to say that he is taking mother's Klonopin 0.5mg   And he is taking 1 1/2  Pills a day since he doesn't have his Ativan.

## 2019-01-30 ENCOUNTER — Ambulatory Visit (INDEPENDENT_AMBULATORY_CARE_PROVIDER_SITE_OTHER): Payer: 59 | Admitting: Psychiatry

## 2019-01-30 ENCOUNTER — Encounter: Payer: Self-pay | Admitting: Psychiatry

## 2019-01-30 ENCOUNTER — Other Ambulatory Visit: Payer: Self-pay

## 2019-01-30 DIAGNOSIS — F4001 Agoraphobia with panic disorder: Secondary | ICD-10-CM

## 2019-01-30 DIAGNOSIS — F411 Generalized anxiety disorder: Secondary | ICD-10-CM

## 2019-01-30 DIAGNOSIS — Z8659 Personal history of other mental and behavioral disorders: Secondary | ICD-10-CM | POA: Diagnosis not present

## 2019-01-30 DIAGNOSIS — F15929 Other stimulant use, unspecified with intoxication, unspecified: Secondary | ICD-10-CM

## 2019-01-30 MED ORDER — CLONAZEPAM 0.5 MG PO TABS: 0.5000 mg | ORAL_TABLET | Freq: Three times a day (TID) | ORAL | 1 refills | Status: DC | PRN

## 2019-01-30 MED ORDER — SERTRALINE HCL 20 MG/ML PO CONC: 30.0000 mg | Freq: Every day | ORAL | 0 refills | Status: DC

## 2019-01-30 NOTE — Progress Notes (Signed)
David Graham [id number] April 27, 1986 33 y.o.   Virtual Visit via Telephone Note  I connected with pt by telephone and verified that I am speaking with the correct person using two identifiers.   I discussed the limitations, risks, security and privacy concerns of performing an evaluation and management service by telephone and the availability of in person appointments. I also discussed with the patient that there may be a patient responsible charge related to this service. The patient expressed understanding and agreed to proceed.   I discussed the assessment and treatment plan with the patient. The patient was provided an opportunity to ask questions and all were answered. The patient agreed with the plan and demonstrated an understanding of the instructions.   The patient was advised to call back or seek an in-person evaluation if the symptoms worsen or if the condition fails to improve as anticipated.  I provided 36 minutes of non-face-to-face time during this encounter. The call started at 218 and ended at [phone]. The patient was located at home and the provider was located also office.   Subjective:   Patient ID:  David Graham is a 33 y.o. (DOB April 27, 1986) male.  Chief Complaint:  Chief Complaint  Patient presents with  . Follow-up    Medication management  . Graham    Medication management    Graham  Symptoms include nervous/anxious behavior. Patient reports no confusion, decreased concentration or suicidal ideas.     David Graham  requested urgent appointment for intolerable Graham.  Lastt visit 01/07/2019 and was to start sertraline solution.  Taking about a ml of Zoloft solution.  Thinks it's messing with his concentration a little.  He didn't think the Ativan was helping that much and mother was concerned.  Says M freaked out and flushed the Ativan.  He's returned to Klonopin.  When had no Bz felt horrible.  Now taking Klonopin 0.5 mg and another 0.25 mg daily bc  getting it from mother so can't take more.    He called over the last couple of days wanting med changes and the clonazepam was increased to 0.5 3 times daily from twice daily.  It was encouraged for him to increase the sertraline but he was reluctant to do so.  At first with Zoloft couldn't sleep but awakens groggy and trying to reduce the caffeine.  Was using preworkout caffeine and stopped it.  Disc withdrawal.  Now 7-8 hours sleep.  Appetite is better.  Can't go to gym now with Covid.  Now Graham is 4-5/10 now.  Realizes certain things trigger Graham for example dating.  Thinks it's bc of the divorce. Last panic was when out of Bz a few days.  With the presence of the clonazepam tends to have residual worry.  2-3 weeks worsening Graham, trouble sleeping, lower appetite, lost 10#.  Meds not working.  Wants relief.  No known pcpt.  Started taking a few more drops of sertraline to see if it would help.  It has been this bad before also.  Reason he hasn't taken much sertraline is sexual se and cloudiness.  Had tried fluoxetine and didn't like it bc made him feel weird.  I feel like I'm immune to clonazepam.  Increase in clonazepam to TID helped a little.  Not taking doxepin bc hangover. Has taken sertraline solution off and on 4-6 drops on occasion.  On verge of panic. Dating a new girl and that's generating Graham.  Graham all day long.  Dating a new girl but does not think that is the total cause of his Graham.  Past psych med intolerances : Fluoxetine, Lexapro caused GI side effects, sertraline most ever 50mg ,  Trileptal caused a rash, Viibryd, Depakote.  Hx Adderall abuse.  Wellbutrin worst of all.  Xanax and Ativan.  Review of Systems:  Review of Systems  Neurological: Negative for tremors and weakness.  Psychiatric/Behavioral: Positive for dysphoric mood. Negative for agitation, behavioral problems, confusion, decreased concentration, hallucinations, self-injury, sleep disturbance and  suicidal ideas. The patient is nervous/anxious. The patient is not hyperactive.     Medications: I have reviewed the patient's current medications.  Current Outpatient Medications  Medication Sig Dispense Refill  . clonazePAM (KLONOPIN) 0.5 MG tablet Take 1 tablet (0.5 mg total) by mouth 3 (three) times daily as needed for Graham. 90 tablet 1  . sertraline (ZOLOFT) 20 MG/ML concentrated solution Take 1.5 mLs (30 mg total) by mouth daily. 45 mL 0   No current facility-administered medications for this visit.     Medication Side Effects: None Tends to have a lot of SE from meds.  Med sensitive.  Allergies:  Allergies  Allergen Reactions  . Oxcarbazepine   . Viibryd [Vilazodone Hcl]     Past Medical History:  Diagnosis Date  . ADHD (attention deficit hyperactivity disorder)   . Graham     History reviewed. No pertinent family history.  Social History   Socioeconomic History  . Marital status: Single    Spouse name: Not on file  . Number of children: Not on file  . Years of education: Not on file  . Highest education level: Not on file  Occupational History  . Not on file  Social Needs  . Financial resource strain: Not on file  . Food insecurity:    Worry: Not on file    Inability: Not on file  . Transportation needs:    Medical: Not on file    Non-medical: Not on file  Tobacco Use  . Smoking status: Never Smoker  . Smokeless tobacco: Current User    Types: Snuff  . Tobacco comment: occassional  Substance and Sexual Activity  . Alcohol use: Yes    Alcohol/week: 0.0 standard drinks  . Drug use: No  . Sexual activity: Not Currently  Lifestyle  . Physical activity:    Days per week: Not on file    Minutes per session: Not on file  . Stress: Not on file  Relationships  . Social connections:    Talks on phone: Not on file    Gets together: Not on file    Attends religious service: Not on file    Active member of club or organization: Not on file     Attends meetings of clubs or organizations: Not on file    Relationship status: Not on file  . Intimate partner violence:    Fear of current or ex partner: Not on file    Emotionally abused: Not on file    Physically abused: Not on file    Forced sexual activity: Not on file  Other Topics Concern  . Not on file  Social History Narrative  . Not on file    Past Medical History, Surgical history, Social history, and Family history were reviewed and updated as appropriate.   Please see review of systems for further details on the patient's review from today.   Objective:   Physical Exam:  There were no vitals taken for this visit.  Physical Exam Neurological:     Mental Status: He is alert and oriented to person, place, and time.     Cranial Nerves: No dysarthria.  Psychiatric:        Attention and Perception: Attention normal.        Mood and Affect: Mood is anxious. Mood is not depressed.        Speech: Speech normal.        Behavior: Behavior is cooperative.        Thought Content: Thought content normal. Thought content is not paranoid or delusional. Thought content does not include homicidal or suicidal ideation. Thought content does not include homicidal or suicidal plan.        Cognition and Memory: Cognition and memory normal.     Comments: Severe Graham.  Insight is fair and judgment fair.     Lab Review:     Component Value Date/Time   NA 142 08/29/2017 1444   NA 136 09/25/2013 1151   K 4.3 08/29/2017 1444   K 4.1 09/25/2013 1151   CL 101 08/29/2017 1444   CL 103 09/25/2013 1151   CO2 26 08/29/2017 1444   CO2 31 09/25/2013 1151   GLUCOSE 73 08/29/2017 1444   GLUCOSE 75 09/25/2013 1151   BUN 12 08/29/2017 1444   BUN 13 09/25/2013 1151   CREATININE 1.19 08/29/2017 1444   CREATININE 1.10 09/25/2013 1151   CALCIUM 9.9 08/29/2017 1444   CALCIUM 9.7 09/25/2013 1151   PROT 8.4 (H) 09/25/2013 1151   ALBUMIN 4.9 08/29/2017 1444   ALBUMIN 4.4 09/25/2013 1151    AST 37 09/25/2013 1151   ALT 32 09/25/2013 1151   ALKPHOS 81 09/25/2013 1151   BILITOT 0.6 09/25/2013 1151   GFRNONAA 81 08/29/2017 1444   GFRNONAA >60 09/25/2013 1151   GFRAA 94 08/29/2017 1444   GFRAA >60 09/25/2013 1151       Component Value Date/Time   WBC 6.0 09/25/2013 1151   RBC 5.34 09/25/2013 1151   HGB 17.1 09/25/2013 1151   HCT 47.7 09/25/2013 1151   PLT 158 09/25/2013 1151   MCV 89 09/25/2013 1151   MCH 32.0 09/25/2013 1151   MCHC 35.8 09/25/2013 1151   RDW 12.7 09/25/2013 1151   LYMPHSABS 1.5 09/25/2013 1151   MONOABS 0.7 09/25/2013 1151   EOSABS 0.3 09/25/2013 1151   BASOSABS 0.1 09/25/2013 1151    No results found for: POCLITH, LITHIUM   No results found for: PHENYTOIN, PHENOBARB, VALPROATE, CBMZ   .res Assessment: Plan:    Panic disorder with agoraphobia  Generalized Graham disorder  History of major depression  Caffeine intoxication with complication (HCC)  Hx Adderall Abuse in remission Rule out OCD  Overall David Graham is improved to a moderate degree since his last appointment.  The sertraline is helping.  He did not have a good response to lorazepam.  I do not believe he was abusing it.  I believe him when he says his mother flushed it down the toilet.  He has been taking his mother's clonazepam at a lower dose since that incident.  He has to go back to clonazepam.  We will okay up to clonazepam 0.5 mg 3 times daily as needed Graham.  #90 with no refills given.  Poor response to Ativan and poor tolerance. Was very brief.  Mother's Klonopin is orange tablet and not Teva.  Any generic except Actavis which works less well.  We discussed the short-term risks associated with benzodiazepines including sedation and  increased fall risk among others.  Discussed long-term side effect risk including tolerance, dependence, potential withdrawal symptoms, and the potential eventual dose-related risk of dementia.  I am quite certain that a significant part  of his recent worsening of Graham is related to caffeine excess.  Patient's that have a history of panic typically are not very tolerant of caffeine and he admits to drinking or consuming too much caffeine.  He says he is reduce the dose significantly but was vague about how much he was still using..  Discussed caffeine withdrawal headaches.  He is complaining of feeling a little cognitively slow and some degree of sexual dysfunction from just 1 mL solution of sertraline.  He has had a partial response but has not been on it long enough to get the full response.  Discussed the possibility of increasing the dosage further but he is somewhat reluctant worrying about cognitive side effects.  Told him that if we can get an effective response we could potentially reduce the dosage after about 3 to 4 months of good effect and probably would be able to keep the benefit and lose some of the side effects.  Again discussed the various ways to prevent Graham and SSRI are SSRIs are the most effective.  As noted he is tried several and prefers Zoloft.  He is willing to take it and stick with it for a while this time.  He is very med sensitive to SSRIs so we will need to use a low dose.    BC very med sensitive..  Disc SE in detail and be patient.  Goal remission.  Call if a problem.  He is med sensitive it is conceivable he might need an even lower dose and if he has a problem we can lower the dose and he is likely to still get benefit if he will be consistent.  There is a history of compliance problems but no evidence of abuse of benzodiazepines.  He does have a past history of abusing Adderall.  Recommend counseling for Graham.  Not done seriously before.  Will refer to Dr. Doran Heater.  Recognizes that divorce issues trigger Graham.   Follow-up 3 weeks  Meredith Staggers, MD, DFAPA   Future Appointments  Date Time Provider Department Center  02/17/2019  4:00 PM Cottle, Steva Ready., MD [facility] None    No orders of the  defined types were placed in this encounter.     -------------------------------

## 2019-02-05 ENCOUNTER — Ambulatory Visit: Payer: 59 | Admitting: Psychiatry

## 2019-02-17 ENCOUNTER — Encounter: Payer: Self-pay | Admitting: Psychiatry

## 2019-02-17 ENCOUNTER — Ambulatory Visit: Payer: 59 | Admitting: Psychiatry

## 2019-02-17 ENCOUNTER — Other Ambulatory Visit: Payer: Self-pay

## 2019-02-17 DIAGNOSIS — F15929 Other stimulant use, unspecified with intoxication, unspecified: Secondary | ICD-10-CM

## 2019-02-17 DIAGNOSIS — F4001 Agoraphobia with panic disorder: Secondary | ICD-10-CM | POA: Diagnosis not present

## 2019-02-17 DIAGNOSIS — Z8659 Personal history of other mental and behavioral disorders: Secondary | ICD-10-CM

## 2019-02-17 DIAGNOSIS — F411 Generalized anxiety disorder: Secondary | ICD-10-CM

## 2019-02-17 NOTE — Progress Notes (Signed)
David Graham [id number] [birth year] 33 y.o.   Virtual Visit via Telephone Note  I connected with pt by telephone and verified that I am speaking with the correct person using two identifiers.   I discussed the limitations, risks, security and privacy concerns of performing an evaluation and management service by telephone and the availability of in person appointments. I also discussed with the patient that there may be a patient responsible charge related to this service. The patient expressed understanding and agreed to proceed.   I discussed the assessment and treatment plan with the patient. The patient was provided an opportunity to ask questions and all were answered. The patient agreed with the plan and demonstrated an understanding of the instructions.   The patient was advised to call back or seek an in-person evaluation if the symptoms worsen or if the condition fails to improve as anticipated.  I provided 36 minutes of non-face-to-face time during this encounter. The call started at 218 and ended at 49. The patient was located at home and the provider was located also office.   Subjective:   Patient ID:  David Graham is a 33 y.o. (DOB 04-04-1986) male.  Chief Complaint:  Chief Complaint  Patient presents with  . Follow-up    Medication management  . Anxiety    Medication management    Anxiety  Patient reports no confusion, decreased concentration, nervous/anxious behavior or suicidal ideas.     David Graham  requested urgent appointment for intolerable anxiety.  Last visit we restarted clonazepam up to 0.5 mg 3 times daily.  He was given #90 and filled the prescription. Varies from 1 1/2 to 3 tablets daily.  Stronger taken with sertraline.    AT visit 01/07/2019 and was to start sertraline solution.  Increased sertaline to 1.5 ml of Zoloft solution from 1ml since last here.    Anxiety is pretty good.  Zoloft has kicked in.  More even keel.    Not depressed.   Concentration is pretty good.  No memory complaints.  Appetite is good.  Exercising.  Sleeping well.  Not having panic attacks now.  Has normal levels of anxiety and manages his anxiety much better.  Past psych med intolerances : Fluoxetine, Lexapro caused GI side effects, sertraline most ever ,  Trileptal caused a rash, Viibryd, Depakote.  Hx Adderall abuse.  Wellbutrin worst of all.  Xanax and Ativan poor response.  Review of Systems:  Review of Systems  Neurological: Negative for tremors and weakness.  Psychiatric/Behavioral: Negative for agitation, behavioral problems, confusion, decreased concentration, dysphoric mood, hallucinations, self-injury, sleep disturbance and suicidal ideas. The patient is not nervous/anxious and is not hyperactive.     Medications: I have reviewed the patient's current medications.  Current Outpatient Medications  Medication Sig Dispense Refill  . clonazePAM (KLONOPIN) 0.5 MG tablet Take 1 tablet (0.5 mg total) by mouth 3 (three) times daily as needed for anxiety. 90 tablet 1  . sertraline (ZOLOFT) 20 MG/ML concentrated solution Take 1.5 mLs (30 mg total) by mouth daily. 45 mL 0   No current facility-administered medications for this visit.     Medication Side Effects: mild delayed ejaculation. Not a big problem.  Night sweats resolved. Tends to have a lot of SE from meds.  Med sensitive.  Allergies:  Allergies  Allergen Reactions  . Oxcarbazepine   . Viibryd [Vilazodone Hcl]     Past Medical History:  Diagnosis Date  . ADHD (attention deficit hyperactivity disorder)   .  Anxiety     History reviewed. No pertinent family history.  Social History   Socioeconomic History  . Marital status: Single    Spouse name: Not on file  . Number of children: Not on file  . Years of education: Not on file  . Highest education level: Not on file  Occupational History  . Not on file  Social Needs  . Financial resource strain: Not on file  . Food  insecurity:    Worry: Not on file    Inability: Not on file  . Transportation needs:    Medical: Not on file    Non-medical: Not on file  Tobacco Use  . Smoking status: Never Smoker  . Smokeless tobacco: Current User    Types: Snuff  . Tobacco comment: occassional  Substance and Sexual Activity  . Alcohol use: Yes    Alcohol/week: 0.0 standard drinks  . Drug use: No  . Sexual activity: Not Currently  Lifestyle  . Physical activity:    Days per week: Not on file    Minutes per session: Not on file  . Stress: Not on file  Relationships  . Social connections:    Talks on phone: Not on file    Gets together: Not on file    Attends religious service: Not on file    Active member of club or organization: Not on file    Attends meetings of clubs or organizations: Not on file    Relationship status: Not on file  . Intimate partner violence:    Fear of current or ex partner: Not on file    Emotionally abused: Not on file    Physically abused: Not on file    Forced sexual activity: Not on file  Other Topics Concern  . Not on file  Social History Narrative  . Not on file    Past Medical History, Surgical history, Social history, and Family history were reviewed and updated as appropriate.   Please see review of systems for further details on the patient's review from today.   Objective:   Physical Exam:  There were no vitals taken for this visit.  Physical Exam Constitutional:      General: He is not in acute distress.    Appearance: He is well-developed.  Musculoskeletal:        General: No deformity.  Neurological:     Mental Status: He is alert and oriented to person, place, and time.     Coordination: Coordination normal.  Psychiatric:        Attention and Perception: Attention normal. He is attentive.        Mood and Affect: Mood is not anxious or depressed. Affect is not labile, blunt, angry or inappropriate.        Speech: Speech normal.        Behavior:  Behavior normal.        Thought Content: Thought content normal. Thought content does not include homicidal or suicidal ideation. Thought content does not include homicidal or suicidal plan.        Cognition and Memory: Cognition normal.        Judgment: Judgment normal.     Comments: Insight is improved     Lab Review:     Component Value Date/Time   NA 142 08/29/2017 1444   NA 136 09/25/2013 1151   K 4.3 08/29/2017 1444   K 4.1 09/25/2013 1151   CL 101 08/29/2017 1444   CL 103 09/25/2013 1151  CO2 26 08/29/2017 1444   CO2 31 09/25/2013 1151   GLUCOSE 73 08/29/2017 1444   GLUCOSE 75 09/25/2013 1151   BUN 12 08/29/2017 1444   BUN 13 09/25/2013 1151   CREATININE 1.19 08/29/2017 1444   CREATININE 1.10 09/25/2013 1151   CALCIUM 9.9 08/29/2017 1444   CALCIUM 9.7 09/25/2013 1151   PROT 8.4 (H) 09/25/2013 1151   ALBUMIN 4.9 08/29/2017 1444   ALBUMIN 4.4 09/25/2013 1151   AST 37 09/25/2013 1151   ALT 32 09/25/2013 1151   ALKPHOS 81 09/25/2013 1151   BILITOT 0.6 09/25/2013 1151   GFRNONAA 81 08/29/2017 1444   GFRNONAA >60 09/25/2013 1151   GFRAA 94 08/29/2017 1444   GFRAA >60 09/25/2013 1151       Component Value Date/Time   WBC 6.0 09/25/2013 1151   RBC 5.34 09/25/2013 1151   HGB 17.1 09/25/2013 1151   HCT 47.7 09/25/2013 1151   PLT 158 09/25/2013 1151   MCV 89 09/25/2013 1151   MCH 32.0 09/25/2013 1151   MCHC 35.8 09/25/2013 1151   RDW 12.7 09/25/2013 1151   LYMPHSABS 1.5 09/25/2013 1151   MONOABS 0.7 09/25/2013 1151   EOSABS 0.3 09/25/2013 1151   BASOSABS 0.1 09/25/2013 1151    No results found for: POCLITH, LITHIUM   No results found for: PHENYTOIN, PHENOBARB, VALPROATE, CBMZ   .res Assessment: Plan:    Panic disorder with agoraphobia  Generalized anxiety disorder  History of major depression  Caffeine intoxication with complication (HCC)  Hx Adderall Abuse in remission Rule out OCD  Greater than 50% of face to face time with patient was spent on  counseling and coordination of care. We discussed the following: Overall [place] anxiety is improved to a very good degree since his last appointment.  It was not well controlled at the last visit.  The sertraline is helping he has increased the dose to 1.5 mL which is equivalent to 30 mg daily since his visit January 07, 2019.  He is satisfied with the response at this point.Marland Kitchen    He did not have a good response to lorazepam.   Poor response to Ativan and poor tolerance.  Continue clonazepam 0.5 mg 3 times daily as needed.  Any generic except Actavis which works less well.  As the anxiety improves gradually reduce the dosage of clonazepam.  PDMP review shows last clonazepam filled on May 15 0.5 mg tablets #90.  That was at his last appointment.  We discussed the short-term risks associated with benzodiazepines including sedation and increased fall risk among others.  Discussed long-term side effect risk including tolerance, dependence, potential withdrawal symptoms, and the potential eventual dose-related risk of dementia.  Disc risk with alcohol and do not combine them.  I am quite certain that a significant part of his recent worsening of anxiety is related to caffeine excess.  I have cautioned him not to engage in excessive caffeine use or he could precipitate panic and anxiety again.  He is having a good response to the sertraline at this dose and mild sexual side effects.  I told him that if we can get an effective response we could potentially reduce the dosage after about 3 to 4 months of good effect and probably would be able to keep the benefit and lose some of the side effects.  Fortunately the night sweats and cognitive complaints have resolved.  Again discussed the various ways to prevent anxiety and SSRI are SSRIs are the most effective.  As  noted he is tried several and prefers Zoloft.  He is willing to take it and stick with it for a while this time.  He is very med sensitive to SSRIs so we  will need to use a low dose.    BC very med sensitive..  Disc SE in detail and be patient.  Goal remission and he is satisfied with the current response..  Call if a problem.   Consider converting sertraline to a 25 mg tablet at follow-up.  There is a history of compliance problems but no evidence of abuse of benzodiazepines.  He does have a past history of abusing Adderall.  He says he will be more committed to consistency with the sertraline because he clearly sees that he needs it.  23-minute appointment  Follow-up 3 months  Meredith Staggersarey Cottle, MD, DFAPA   No future appointments.  No orders of the defined types were placed in this encounter.     -------------------------------

## 2019-02-27 ENCOUNTER — Other Ambulatory Visit: Payer: Self-pay | Admitting: Psychiatry

## 2019-03-04 ENCOUNTER — Telehealth: Payer: Self-pay | Admitting: Psychiatry

## 2019-03-04 ENCOUNTER — Other Ambulatory Visit: Payer: Self-pay | Admitting: Psychiatry

## 2019-03-04 MED ORDER — BUSPIRONE HCL 10 MG PO TABS: ORAL_TABLET | ORAL | 2 refills | Status: DC

## 2019-03-04 NOTE — Telephone Encounter (Signed)
Pt said everything is working well but wants to know if he can take buspar with his antidepressant. Please call with any questions.

## 2019-03-04 NOTE — Progress Notes (Signed)
Okay a trial buspirone 5 mg twice daily for 7 days then 10 mg twice daily in hopes of reducing sexual side effects from SSRI as well as providing additional antianxiety effects.  Prescription sent to his pharmacy of record.  Primary side effect risk is dizziness or nausea an hour after he takes it lasting approximately an hour but most people do not have this. 

## 2019-03-04 NOTE — Telephone Encounter (Signed)
Left voicemail with information and to call back with any questions or concerns. 

## 2019-03-04 NOTE — Telephone Encounter (Signed)
Okay a trial buspirone 5 mg twice daily for 7 days then 10 mg twice daily in hopes of reducing sexual side effects from SSRI as well as providing additional antianxiety effects.  Prescription sent to his pharmacy of record.  Primary side effect risk is dizziness or nausea an hour after he takes it lasting approximately an hour but most people do not have this.

## 2019-03-22 ENCOUNTER — Other Ambulatory Visit: Payer: Self-pay | Admitting: Psychiatry

## 2019-03-22 NOTE — Telephone Encounter (Signed)
Ok to fill not due yet

## 2019-04-02 ENCOUNTER — Encounter: Payer: Self-pay | Admitting: Family Medicine

## 2019-04-02 ENCOUNTER — Other Ambulatory Visit: Payer: Self-pay

## 2019-04-02 ENCOUNTER — Ambulatory Visit: Payer: 59 | Admitting: Family Medicine

## 2019-04-02 VITALS — BP 146/82 | HR 64 | Ht 71.0 in | Wt 211.0 lb

## 2019-04-02 DIAGNOSIS — Z711 Person with feared health complaint in whom no diagnosis is made: Secondary | ICD-10-CM | POA: Diagnosis not present

## 2019-04-02 DIAGNOSIS — Z8659 Personal history of other mental and behavioral disorders: Secondary | ICD-10-CM | POA: Diagnosis not present

## 2019-04-02 DIAGNOSIS — R002 Palpitations: Secondary | ICD-10-CM | POA: Diagnosis not present

## 2019-04-02 DIAGNOSIS — Z0000 Encounter for general adult medical examination without abnormal findings: Secondary | ICD-10-CM

## 2019-04-02 LAB — BASIC METABOLIC PANEL
BUN: 8 (ref 4–21)
Creatinine: 1.1 (ref 0.6–1.3)
Glucose: 96
Potassium: 4.4 (ref 3.4–5.3)
Sodium: 139 (ref 137–147)

## 2019-04-02 LAB — LIPID PANEL
Cholesterol: 220 — AB (ref 0–200)
HDL: 69 (ref 35–70)
LDL Cholesterol: 136
Triglycerides: 75 (ref 40–160)

## 2019-04-02 LAB — TSH: TSH: 1.97 (ref 0.41–5.90)

## 2019-04-02 LAB — CBC AND DIFFERENTIAL
HCT: 43 (ref 41–53)
Hemoglobin: 15.2 (ref 13.5–17.5)
Platelets: 136 — AB (ref 150–399)
WBC: 3.4

## 2019-04-02 LAB — HM HIV SCREENING LAB: HM HIV Screening: NEGATIVE

## 2019-04-02 NOTE — Progress Notes (Addendum)
Date:  04/02/2019   Name:  David Graham   DOB:  1986-08-23   MRN:  [id number]   Chief Complaint: Annual Exam (hasn't had one in a while)  Patient is a 33 year old male  who presents for a comprehensive physical exam. The patient reports the following problems: palpitations/malaise/fatigue/ concern std. Health maintenance has been reviewed up to date. Palpitations  This is a new problem. The current episode started more than 1 month ago. The problem occurs intermittently. The problem has been waxing and waning. The symptoms are aggravated by caffeine and exercise (preworkout supplement). Associated symptoms include dizziness and an irregular heartbeat. Pertinent negatives include no anxiety, chest fullness, chest pain, coughing, diaphoresis, fever, malaise/fatigue, nausea, near-syncope, numbness, shortness of breath, syncope, vomiting or weakness. He has tried nothing for the symptoms. Risk factors include being male. His past medical history is significant for anxiety. There is no history of anemia, drug use, heart disease or a valve disorder. exercise supplement    Review of Systems  Constitutional: Negative for chills, diaphoresis, fever and malaise/fatigue.  HENT: Negative for drooling, ear discharge, ear pain and sore throat.   Respiratory: Negative for cough, shortness of breath and wheezing.   Cardiovascular: Positive for palpitations. Negative for chest pain, leg swelling, syncope and near-syncope.  Gastrointestinal: Negative for abdominal pain, blood in stool, constipation, diarrhea, nausea and vomiting.  Endocrine: Negative for polydipsia.  Genitourinary: Negative for dysuria, frequency, hematuria and urgency.  Musculoskeletal: Negative for back pain, myalgias and neck pain.  Skin: Negative for rash.  Allergic/Immunologic: Negative for environmental allergies.  Neurological: Positive for dizziness. Negative for weakness, numbness and headaches.  Hematological: Does not  bruise/bleed easily.  Psychiatric/Behavioral: Negative for suicidal ideas. The patient is not nervous/anxious.     Patient Active Problem List   Diagnosis Date Noted  . Panic disorder 08/03/2018  . Episodic mood disorder (HCC) 08/03/2018    Allergies  Allergen Reactions  . Oxcarbazepine   . Viibryd [Vilazodone Hcl]     No past surgical history on file.  Social History   Tobacco Use  . Smoking status: Never Smoker  . Smokeless tobacco: Current User    Types: Snuff  . Tobacco comment: occassional  Substance Use Topics  . Alcohol use: Yes    Alcohol/week: 0.0 standard drinks  . Drug use: No     Medication list has been reviewed and updated.  Current Meds  Medication Sig  . clonazePAM (KLONOPIN) 0.5 MG tablet TAKE 1 TABLET (0.5 MG TOTAL) BY MOUTH 3 (THREE) TIMES DAILY AS NEEDED FOR ANXIETY. (Patient taking differently: Take 0.5 mg by mouth 3 (three) times daily as needed for anxiety. Dr Jennelle Humanottle)  . sertraline (ZOLOFT) 20 MG/ML concentrated solution TAKE 1.5 ML (30 MG TOTAL) BY MOUTH DAILY. (Patient taking differently: Dr Jennelle Humanottle)    [facility] 04/02/2019 08/29/2017  PHQ - 2 Score 0 1  PHQ- 9 Score 3 -    BP Readings from Last 3 Encounters:  04/02/19 (!) 146/82  08/29/17 138/84  04/11/15 130/80    Physical Exam Vitals signs and nursing note reviewed.  Constitutional:      Appearance: Normal appearance. He is well-developed and normal weight.     Comments: muscular  HENT:     Head: Normocephalic.     Jaw: There is normal jaw occlusion.     Right Ear: Hearing, tympanic membrane, ear canal and external ear normal.     Left Ear: Hearing, tympanic membrane, ear canal  and external ear normal.     Nose: Nose normal.     Right Turbinates: Not enlarged.     Left Turbinates: Not enlarged.     Mouth/Throat:     Lips: Pink.     Mouth: Mucous membranes are moist.     Dentition: Normal dentition.     Pharynx: Oropharynx is clear. Uvula midline.     Tonsils: No  tonsillar exudate or tonsillar abscesses.  Eyes:     General: Lids are normal. Vision grossly intact. Gaze aligned appropriately. No visual field deficit or scleral icterus.       Right eye: No discharge.        Left eye: No discharge.     Extraocular Movements: Extraocular movements intact.     Right eye: Normal extraocular motion.     Left eye: Normal extraocular motion.     Conjunctiva/sclera: Conjunctivae normal.     Pupils: Pupils are equal, round, and reactive to light.     Funduscopic exam:    Right eye: No AV nicking.        Left eye: No AV nicking.  Neck:     Musculoskeletal: Full passive range of motion without pain, normal range of motion and neck supple. No muscular tenderness.     Thyroid: No thyroid mass, thyromegaly or thyroid tenderness.     Vascular: Normal carotid pulses. No carotid bruit, hepatojugular reflux or JVD.     Trachea: Trachea and phonation normal. No tracheal deviation.  Cardiovascular:     Rate and Rhythm: Normal rate and regular rhythm.     Chest Wall: PMI is not displaced.     Pulses: Normal pulses.          Carotid pulses are 2+ on the right side and 2+ on the left side.      Radial pulses are 2+ on the right side and 2+ on the left side.       Femoral pulses are 2+ on the right side and 2+ on the left side.      Popliteal pulses are 2+ on the right side and 2+ on the left side.       Dorsalis pedis pulses are 2+ on the right side and 2+ on the left side.       Posterior tibial pulses are 2+ on the right side and 2+ on the left side.     Heart sounds: Normal heart sounds, S1 normal and S2 normal. Heart sounds not distant. No murmur. No systolic murmur. No diastolic murmur. No friction rub. No gallop. No S3 or S4 sounds.   Pulmonary:     Effort: No respiratory distress.     Breath sounds: Normal breath sounds. No decreased breath sounds, wheezing, rhonchi or rales.  Chest:     Chest wall: No mass.     Breasts:        Right: Normal. No mass.         Left: Normal. No mass.  Abdominal:     General: Bowel sounds are normal. There is no distension.     Palpations: Abdomen is soft. There is no hepatomegaly, splenomegaly or mass.     Tenderness: There is no abdominal tenderness. There is no guarding or rebound.     Hernia: No hernia is present.  Genitourinary:    Penis: Normal.      Scrotum/Testes: Normal.        Right: Mass not present.        Left:  Mass not present.     Epididymis:     Right: Normal.     Left: Normal.     Prostate: Normal. Not enlarged, not tender and no nodules present.     Rectum: Normal. Guaiac result negative.  Musculoskeletal: Normal range of motion.        General: No tenderness.     Lumbar back: Normal.     Right lower leg: No edema.     Left lower leg: No edema.  Lymphadenopathy:     Cervical: No cervical adenopathy.     Right cervical: No superficial, deep or posterior cervical adenopathy.    Left cervical: No superficial, deep or posterior cervical adenopathy.     Upper Body:     Right upper body: No supraclavicular or axillary adenopathy.     Left upper body: No supraclavicular or axillary adenopathy.  Skin:    General: Skin is warm.     Capillary Refill: Capillary refill takes less than 2 seconds.     Findings: No rash.  Neurological:     Mental Status: He is alert and oriented to person, place, and time.     Cranial Nerves: Cranial nerves are intact. No cranial nerve deficit.     Sensory: Sensation is intact. No sensory deficit.     Motor: Motor function is intact.     Deep Tendon Reflexes: Reflexes are normal and symmetric.     Reflex Scores:      Tricep reflexes are 2+ on the right side and 2+ on the left side.      Bicep reflexes are 2+ on the right side and 2+ on the left side.      Brachioradialis reflexes are 2+ on the right side and 2+ on the left side.      Patellar reflexes are 2+ on the right side and 2+ on the left side.      Achilles reflexes are 2+ on the right side and 2+ on the  left side.    Wt Readings from Last 3 Encounters:  04/02/19 211 lb (95.7 kg)  08/29/17 208 lb (94.3 kg)  04/11/15 203 lb (92.1 kg)    BP (!) 146/82   Pulse 64   Ht 5\' 11"  (1.803 m)   Wt 211 lb (95.7 kg)   SpO2 100%   BMI 29.43 kg/m   Assessment and Plan: 1. Annual physical exam No subjective/objective concerns noted on exam other than the pre-noted palpitations.  Patient's chart was reviewed for previous encounters, labs, specialist notes as available.  Check comprehensive metabolic panel CBC lipid panel and TSH. David Graham is a 33 y.o. male who presents today for his Complete Annual Exam. He feels fairly well. He reports exercising excessively. He reports he is sleeping fairly well. Immunizations are reviewed and recommendations provided.   Age appropriate screening tests are discussed. Counseling given for risk factor reduction interventions. - Comprehensive metabolic panel - CBC with Differential/Platelet - Lipid Panel With LDL/HDL Ratio - TSH  2. Intermittent palpitations Patient has had episodes of palpitations particularly when he is having workouts at the gym and perhaps is associated with "pre-workout "operations./Minutes and is not associated with chest discomfort or near syncope.  Patient will have an EKG to rule out any persistent issues. I have reviewed EKG which shows normal sinus rhythm with variable rate which translates to an irregularity.  But PR intervals are normal as well as the other intervals.  There is no signs of ischemia such  as Q waves abnormal inferior ST-T wave changes and no depression of R wave progression. Comparison to previous EKG dated 9/15 notes bradycardia and no other abnormalities.  Patient's been instructed to return if the palpitations continue and we may need to do a Holter monitor in the future if these become more symptomatic.  Patient's been reassured that there is no persistent evidence of cardiac damage or hypertrophy.- EKG 12-Lead - TSH   3. History of anxiety Patient has a history of anxiety for which she is followed by psychiatry.  4. Concern about STD in male without diagnosis Patient also has concerns over exposure to STD and will check HIV status as well as RPR and GC chlamydia probe. - HIV Antibody (routine testing w rflx) - RPR - GC/Chlamydia Probe Amp([facility])

## 2019-04-07 ENCOUNTER — Other Ambulatory Visit: Payer: Self-pay

## 2019-04-08 LAB — CBC WITH DIFFERENTIAL/PLATELET
Basophils Absolute: 0 10*3/uL (ref 0.0–0.2)
Basos: 1 %
EOS (ABSOLUTE): 0.2 10*3/uL (ref 0.0–0.4)
Eos: 5 %
Hematocrit: 43.2 % (ref 37.5–51.0)
Hemoglobin: 15.2 g/dL (ref 13.0–17.7)
Immature Grans (Abs): 0 10*3/uL (ref 0.0–0.1)
Immature Granulocytes: 0 %
Lymphocytes Absolute: 1.1 10*3/uL (ref 0.7–3.1)
Lymphs: 34 %
MCH: 32.3 pg (ref 26.6–33.0)
MCHC: 35.2 g/dL (ref 31.5–35.7)
MCV: 92 fL (ref 79–97)
Monocytes Absolute: 0.4 10*3/uL (ref 0.1–0.9)
Monocytes: 11 %
Neutrophils Absolute: 1.7 10*3/uL (ref 1.4–7.0)
Neutrophils: 49 %
Platelets: 136 10*3/uL — ABNORMAL LOW (ref 150–450)
RBC: 4.7 x10E6/uL (ref 4.14–5.80)
RDW: 12.9 % (ref 11.6–15.4)
WBC: 3.4 10*3/uL (ref 3.4–10.8)

## 2019-04-08 LAB — COMPREHENSIVE METABOLIC PANEL
ALT: 26 IU/L (ref 0–44)
AST: 22 IU/L (ref 0–40)
Albumin/Globulin Ratio: 2.2 (ref 1.2–2.2)
Albumin: 4.8 g/dL (ref 4.0–5.0)
Alkaline Phosphatase: 51 IU/L (ref 39–117)
BUN/Creatinine Ratio: 8 — ABNORMAL LOW (ref 9–20)
BUN: 8 mg/dL (ref 6–20)
Bilirubin Total: 0.6 mg/dL (ref 0.0–1.2)
CO2: 23 mmol/L (ref 20–29)
Calcium: 9.8 mg/dL (ref 8.7–10.2)
Chloride: 100 mmol/L (ref 96–106)
Creatinine, Ser: 1.06 mg/dL (ref 0.76–1.27)
GFR calc Af Amer: 108 mL/min/{1.73_m2} (ref >59)
GFR calc non Af Amer: 93 mL/min/{1.73_m2} (ref >59)
Globulin, Total: 2.2 g/dL (ref 1.5–4.5)
Glucose: 96 mg/dL (ref 65–99)
Potassium: 4.4 mmol/L (ref 3.5–5.2)
Sodium: 139 mmol/L (ref 134–144)
Total Protein: 7 g/dL (ref 6.0–8.5)

## 2019-04-08 LAB — TSH: TSH: 1.97 u[IU]/mL (ref 0.450–4.500)

## 2019-04-08 LAB — LIPID PANEL WITH LDL/HDL RATIO
Cholesterol, Total: 220 mg/dL — ABNORMAL HIGH (ref 100–199)
HDL: 69 mg/dL (ref >39)
LDL Calculated: 136 mg/dL — ABNORMAL HIGH (ref 0–99)
LDl/HDL Ratio: 2 ratio (ref 0.0–3.6)
Triglycerides: 75 mg/dL (ref 0–149)
VLDL Cholesterol Cal: 15 mg/dL (ref 5–40)

## 2019-04-08 LAB — GC/CHLAMYDIA PROBE AMP
Chlamydia trachomatis, NAA: NEGATIVE
Neisseria Gonorrhoeae by PCR: NEGATIVE

## 2019-04-08 LAB — HIV ANTIBODY (ROUTINE TESTING W REFLEX): HIV Screen 4th Generation wRfx: NONREACTIVE

## 2019-04-08 LAB — RPR: RPR Ser Ql: NONREACTIVE

## 2019-05-20 ENCOUNTER — Other Ambulatory Visit: Payer: Self-pay

## 2019-05-20 ENCOUNTER — Ambulatory Visit (INDEPENDENT_AMBULATORY_CARE_PROVIDER_SITE_OTHER): Payer: 59 | Admitting: Psychiatry

## 2019-05-20 ENCOUNTER — Encounter: Payer: Self-pay | Admitting: Psychiatry

## 2019-05-20 DIAGNOSIS — F902 Attention-deficit hyperactivity disorder, combined type: Secondary | ICD-10-CM | POA: Diagnosis not present

## 2019-05-20 DIAGNOSIS — F4001 Agoraphobia with panic disorder: Secondary | ICD-10-CM

## 2019-05-20 DIAGNOSIS — F39 Unspecified mood [affective] disorder: Secondary | ICD-10-CM

## 2019-05-20 DIAGNOSIS — F411 Generalized anxiety disorder: Secondary | ICD-10-CM | POA: Diagnosis not present

## 2019-05-20 MED ORDER — MODAFINIL 200 MG PO TABS: 200.0000 mg | ORAL_TABLET | Freq: Every day | ORAL | 0 refills | Status: DC

## 2019-05-20 MED ORDER — CLONAZEPAM 0.5 MG PO TABS: 0.5000 mg | ORAL_TABLET | Freq: Three times a day (TID) | ORAL | 3 refills | Status: DC | PRN

## 2019-05-20 MED ORDER — SERTRALINE HCL 20 MG/ML PO CONC: 60.0000 mg | Freq: Every day | ORAL | 3 refills | Status: DC

## 2019-05-20 NOTE — Progress Notes (Signed)
David Graham [id number] 06-07-1986 33 y.o.     Subjective:   Patient ID:  David Graham is a 33 y.o. (DOB 05/29/1986) male.  Chief Complaint:  Chief Complaint  Patient presents with  . Follow-up    Medication management  . Other    Panic Disorder    Anxiety Symptoms include decreased concentration. Patient reports no confusion, nervous/anxious behavior or suicidal ideas.     David Graham  Follow up for intolerable anxiety.  AT visit 01/07/2019 and was to start sertraline solution.  Increased sertaline to 1.5 ml of Zoloft solution from 54ml.    His last visit was February 17, 2019.  His anxiety was significantly improved with the sertraline suspension at 30 mg daily.  Called 6/17 wanting to try buspirone and 10 mg BID sent in.  He felt it elevated anxiety and stopped it.  Still on sertraline 30 mg suspension.  Klonopin 0.5 mg up to 3 daily but tries to limit it.  Average 2 daily.  He feels he can't go later.   Example meetings trigger anxiety. He doesn't see SE.  Taking more logical steps to get his life under better control and going pretty well.  Doesn't want to change meds.  Anxiety has been alright and about the same as when last seen.    Feels he had less anxiety when he was taking Adderall.  He felt more confident.  Was more effective at work.  Was tested for ADD in the past and had a positive. 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  My mind was sharper than it is now. Asking for alternative ADD medication  Not depressed.   No memory complaints.  Appetite is good.  Exercising.  Sleeping well.  Not having panic attacks now.  Has normal levels of anxiety and manages his anxiety much better.  Past psych med intolerances : Fluoxetine, Lexapro caused GI side effects, sertraline most ever 50mg ,  Trileptal caused a rash, Viibryd, Depakote.  Hx Adderall abuse.  Wellbutrin worst of all.  Xanax and Ativan poor response.  Review of Systems:  Review of Systems  Neurological:  Negative for tremors, weakness and headaches.  Psychiatric/Behavioral: Positive for decreased concentration. Negative for agitation, behavioral problems, confusion, dysphoric mood, hallucinations, self-injury, sleep disturbance and suicidal ideas. The patient is not nervous/anxious and is not hyperactive.     Medications: I have reviewed the patient's current medications.  Current Outpatient Medications  Medication Sig Dispense Refill  . clonazePAM (KLONOPIN) 0.5 MG tablet Take 1 tablet (0.5 mg total) by mouth 3 (three) times daily as needed for anxiety. 90 tablet 3  . sertraline (ZOLOFT) 20 MG/ML concentrated solution Take 3 mLs (60 mg total) by mouth daily. 90 mL 3  . modafinil (PROVIGIL) 200 MG tablet Take 1 tablet (200 mg total) by mouth daily. 30 tablet 0   No current facility-administered medications for this visit.     Medication Side Effects: mild delayed ejaculation. Not a big problem.  Night sweats resolved. Tends to have a lot of SE from meds.  Med sensitive.  Allergies:  Allergies  Allergen Reactions  . Oxcarbazepine   . Viibryd [Vilazodone Hcl]     Past Medical History:  Diagnosis Date  . ADHD (attention deficit hyperactivity disorder)   . Anxiety     History reviewed. No pertinent family history.  Social History   Socioeconomic History  . Marital status: Single    Spouse name: Not on file  . Number of  children: Not on file  . Years of education: Not on file  . Highest education level: Not on file  Occupational History  . Not on file  Social Needs  . Financial resource strain: Not on file  . Food insecurity    Worry: Not on file    Inability: Not on file  . Transportation needs    Medical: Not on file    Non-medical: Not on file  Tobacco Use  . Smoking status: Never Smoker  . Smokeless tobacco: Current User    Types: Snuff  . Tobacco comment: occassional  Substance and Sexual Activity  . Alcohol use: Yes    Alcohol/week: 0.0 standard drinks  .  Drug use: No  . Sexual activity: Not Currently  Lifestyle  . Physical activity    Days per week: Not on file    Minutes per session: Not on file  . Stress: Not on file  Relationships  . Social Musicianconnections    Talks on phone: Not on file    Gets together: Not on file    Attends religious service: Not on file    Active member of club or organization: Not on file    Attends meetings of clubs or organizations: Not on file    Relationship status: Not on file  . Intimate partner violence    Fear of current or ex partner: Not on file    Emotionally abused: Not on file    Physically abused: Not on file    Forced sexual activity: Not on file  Other Topics Concern  . Not on file  Social History Narrative  . Not on file    Past Medical History, Surgical history, Social history, and Family history were reviewed and updated as appropriate.   Please see review of systems for further details on the patient's review from today.   Objective:   Physical Exam:  There were no vitals taken for this visit.  Physical Exam Constitutional:      General: He is not in acute distress.    Appearance: He is well-developed.  Musculoskeletal:        General: No deformity.  Neurological:     Mental Status: He is alert and oriented to person, place, and time.     Coordination: Coordination normal.  Psychiatric:        Attention and Perception: Attention normal. He is attentive.        Mood and Affect: Mood is not anxious or depressed. Affect is not labile, blunt, angry or inappropriate.        Speech: Speech normal.        Behavior: Behavior normal.        Thought Content: Thought content normal. Thought content does not include homicidal or suicidal ideation. Thought content does not include homicidal or suicidal plan.        Cognition and Memory: Cognition normal.        Judgment: Judgment normal.     Comments: Insight is improved Intense style     Lab Review:     Component Value Date/Time    NA 139 04/02/2019 1040   NA 136 09/25/2013 1151   K 4.4 04/02/2019 1040   K 4.1 09/25/2013 1151   CL 100 04/02/2019 1040   CL 103 09/25/2013 1151   CO2 23 04/02/2019 1040   CO2 31 09/25/2013 1151   GLUCOSE 96 04/02/2019 1040   GLUCOSE 75 09/25/2013 1151   BUN 8 04/02/2019 1040   BUN  13 09/25/2013 1151   CREATININE 1.06 04/02/2019 1040   CREATININE 1.10 09/25/2013 1151   CALCIUM 9.8 04/02/2019 1040   CALCIUM 9.7 09/25/2013 1151   PROT 7.0 04/02/2019 1040   PROT 8.4 (H) 09/25/2013 1151   ALBUMIN 4.8 04/02/2019 1040   ALBUMIN 4.4 09/25/2013 1151   AST 22 04/02/2019 1040   AST 37 09/25/2013 1151   ALT 26 04/02/2019 1040   ALT 32 09/25/2013 1151   ALKPHOS 51 04/02/2019 1040   ALKPHOS 81 09/25/2013 1151   BILITOT 0.6 04/02/2019 1040   BILITOT 0.6 09/25/2013 1151   GFRNONAA 93 04/02/2019 1040   GFRNONAA >60 09/25/2013 1151   GFRAA 108 04/02/2019 1040   GFRAA >60 09/25/2013 1151       Component Value Date/Time   WBC 3.4 04/02/2019 1040   WBC 6.0 09/25/2013 1151   RBC 4.70 04/02/2019 1040   RBC 5.34 09/25/2013 1151   HGB 15.2 04/02/2019 1040   HCT 43.2 04/02/2019 1040   PLT 136 (L) 04/02/2019 1040   MCV 92 04/02/2019 1040   MCV 89 09/25/2013 1151   MCH 32.3 04/02/2019 1040   MCH 32.0 09/25/2013 1151   MCHC 35.2 04/02/2019 1040   MCHC 35.8 09/25/2013 1151   RDW 12.9 04/02/2019 1040   RDW 12.7 09/25/2013 1151   LYMPHSABS 1.1 04/02/2019 1040   LYMPHSABS 1.5 09/25/2013 1151   MONOABS 0.7 09/25/2013 1151   EOSABS 0.2 04/02/2019 1040   EOSABS 0.3 09/25/2013 1151   BASOSABS 0.0 04/02/2019 1040   BASOSABS 0.1 09/25/2013 1151    No results found for: POCLITH, LITHIUM   No results found for: PHENYTOIN, PHENOBARB, VALPROATE, CBMZ   .res Assessment: Plan:    Attention deficit hyperactivity disorder (ADHD), combined type - Plan: modafinil (PROVIGIL) 200 MG tablet  Panic disorder with agoraphobia - Plan: clonazePAM (KLONOPIN) 0.5 MG tablet, sertraline (ZOLOFT) 20  MG/ML concentrated solution  Generalized anxiety disorder - Plan: sertraline (ZOLOFT) 20 MG/ML concentrated solution  Episodic mood disorder (HCC) - Plan: sertraline (ZOLOFT) 20 MG/ML concentrated solution  Hx Adderall Abuse in remission Rule out OCD  Greater than 50% of face to face time with patient was spent on counseling and coordination of care. We discussed the following: Overall [place] anxiety is improved to a very good degree since his last appointment.  It was not well controlled before this dose of sertraline.  The sertraline is helping he has increased the dose to 1.5 mL which is equivalent to 30 mg daily since his visit January 07, 2019.  He is satisfied with the response at this point.Marland Kitchen    He did not have a good response to lorazepam.   Poor response to Ativan and poor tolerance.  Continue clonazepam 0.5 mg 3 times daily as needed.  Any generic except Actavis which works less well.  As the anxiety improves gradually reduce the dosage of clonazepam.  Encouraged is low the use of this is absolutely possible given his concerns about attention and focus.  Discussed how this can interfere with attention and focus even if he were to take a stimulant.  PDMP review shows last clonazepam filled on May 15 0.5 mg tablets #90.  That was at his last appointment.  We discussed the short-term risks associated with benzodiazepines including sedation and increased fall risk among others.  Discussed long-term side effect risk including tolerance, dependence, potential withdrawal symptoms, and the potential eventual dose-related risk of dementia.  Disc risk with alcohol and do not combine them.  We will not prescribe traditional stimulants because a history of Adderall abuse.  It is reasonable to consider an alternative type product because he does have a history of diagnosed ADD.  Modafinil would be the most logical choice.  Option Strattera.  Was explained is not FDA approved.  Discussed the risks of  rash and other side effects.  It is potentially abusable to but much less likely to be abused and easier to monitor. Start modafinil 200 mg tablet one half every morning for 7 days then 1 every morning if needed.  Do not exceed this recommended dose.  History of caffeine excess.  Discussed he will be more sensitive to caffeine on modafinil and to limit caffeine use.  His panic disorder and generalized anxiety has largely been under control with the sertraline is no longer complaining of the side effects that he had in the past and he does not want dosage change.  He has had better response because he has been more compliant and consistent with the medication. He will have relapse if he discontinues these medications. Consider converting sertraline to a 25 mg tablet at follow-up.  Continue sertraline 30 mg daily Continue clonazepam but try to limit the dosage for cognitive reasons.  Discussed withdrawal symptoms if he cuts the dose too quickly  Greater than 50% of  30 min face to face time with patient was spent on counseling and coordination of care.   Follow-up 2-3 months  Meredith Staggersarey Cottle, MD, DFAPA   Future Appointments  Date Time Provider Department Center  09/23/2019  4:00 PM Cottle, Steva Readyarey G Jr., MD [facility] None    No orders of the defined types were placed in this encounter.     -------------------------------

## 2019-05-21 ENCOUNTER — Telehealth: Payer: Self-pay | Admitting: Psychiatry

## 2019-05-21 NOTE — Telephone Encounter (Signed)
Indication for modafinil is ADHD which is not an FDA approved indication so he needs to use good Rx.  I told him that that would be necessary.  He will need the modafinil sent to a different pharmacy because [facility] will be too expensive

## 2019-05-21 NOTE — Telephone Encounter (Signed)
Undray called to say that he pharmacy, [facility], told him his modafinil would require PA.  He said you told him he may need to use [facility].  He has looked up the cost w/ [facility] and is okay with using Good[facility].  If you feel it won't be approved through insurance, please send a new prescription the the [facility] in [place], [place].  Please call him to let him know if this is what you decide to do.  You can call is cell [phone] or his work # [phone].

## 2019-05-21 NOTE — Telephone Encounter (Signed)
Until Dr. Clovis Pu finishes his note I'm not sure the diagnosis and reason for medication, will check once note completed.

## 2019-05-22 ENCOUNTER — Other Ambulatory Visit: Payer: Self-pay

## 2019-05-22 DIAGNOSIS — F902 Attention-deficit hyperactivity disorder, combined type: Secondary | ICD-10-CM

## 2019-05-22 MED ORDER — MODAFINIL 200 MG PO TABS: 200.0000 mg | ORAL_TABLET | Freq: Every day | ORAL | 0 refills | Status: DC

## 2019-05-22 NOTE — Telephone Encounter (Signed)
Pt. Made aware and verbalized understanding.

## 2019-05-29 ENCOUNTER — Telehealth: Payer: Self-pay | Admitting: Family Medicine

## 2019-05-29 NOTE — Telephone Encounter (Signed)
Patient went into the ER this past Monday they done labs and ekg for high Bp. Patient said that the ER dr said to follow up with provider. I set him up for next Tuesday .

## 2019-06-02 ENCOUNTER — Other Ambulatory Visit: Payer: Self-pay | Admitting: Psychiatry

## 2019-06-02 ENCOUNTER — Ambulatory Visit: Payer: 59 | Admitting: Family Medicine

## 2019-06-05 ENCOUNTER — Telehealth: Payer: Self-pay | Admitting: Psychiatry

## 2019-06-09 ENCOUNTER — Telehealth: Payer: Self-pay | Admitting: Psychiatry

## 2019-06-09 NOTE — Telephone Encounter (Signed)
Recently started Modafanil.  Can Modafanil cause an increase in blood pressure?  He is noticing an increase in BP  150/90 around there.

## 2019-06-09 NOTE — Telephone Encounter (Signed)
Modafinil is much less likely to affect BP than other stimulants.  Never seen it before.  He should reduce any caffeine and then monitor his BP  150/90 is not severe.  Keep monitoring it.

## 2019-06-10 NOTE — Telephone Encounter (Signed)
Pt. Made aware and will continue monitoring  BP.

## 2019-06-17 ENCOUNTER — Other Ambulatory Visit: Payer: Self-pay

## 2019-06-17 ENCOUNTER — Ambulatory Visit: Payer: 59 | Admitting: Family Medicine

## 2019-06-17 ENCOUNTER — Encounter: Payer: Self-pay | Admitting: Family Medicine

## 2019-06-17 VITALS — BP 128/82 | HR 64 | Ht 71.0 in | Wt 211.0 lb

## 2019-06-17 DIAGNOSIS — R030 Elevated blood-pressure reading, without diagnosis of hypertension: Secondary | ICD-10-CM

## 2019-06-17 NOTE — Patient Instructions (Signed)

## 2019-06-17 NOTE — Progress Notes (Signed)
Date:  06/17/2019   Name:  David Graham   DOB:  01-Jun-1986   MRN:  [id number]   Chief Complaint: Hypertension (blood pressure recheck)  Hypertension This is a new problem. The current episode started in the past 7 days. The problem has been gradually improving since onset. The problem is controlled. Pertinent negatives include no anxiety, blurred vision, chest pain, headaches, malaise/fatigue, neck pain, orthopnea, palpitations, peripheral edema, PND, shortness of breath or sweats. There are no associated agents to hypertension. Risk factors: athletic preparations. Past treatments include nothing. The current treatment provides moderate (stoppage Provigil) improvement. Compliance problems include diet.  There is no history of angina, kidney disease, CAD/MI, CVA, heart failure, left ventricular hypertrophy, PVD or retinopathy. retinal hemorrhage. There is no history of chronic renal disease, a hypertension causing med or renovascular disease. [facility].    Review of Systems  Constitutional: Negative for chills, fever and malaise/fatigue.  HENT: Negative for drooling, ear discharge, ear pain and sore throat.   Eyes: Negative for blurred vision, pain and redness.  Respiratory: Negative for cough, shortness of breath and wheezing.   Cardiovascular: Negative for chest pain, palpitations, orthopnea, leg swelling and PND.  Gastrointestinal: Negative for abdominal pain, blood in stool, constipation, diarrhea and nausea.  Endocrine: Negative for polydipsia.  Genitourinary: Negative for dysuria, frequency, hematuria and urgency.  Musculoskeletal: Negative for back pain, myalgias and neck pain.  Skin: Negative for rash.  Allergic/Immunologic: Negative for environmental allergies.  Neurological: Negative for dizziness, syncope and headaches.  Hematological: Does not bruise/bleed easily.  Psychiatric/Behavioral: Negative for suicidal ideas. The patient is not nervous/anxious.     Patient  Active Problem List   Diagnosis Date Noted  . Panic disorder 08/03/2018  . Episodic mood disorder ([place]) 08/03/2018    Allergies  Allergen Reactions  . Oxcarbazepine   . Viibryd [Vilazodone Hcl]     No past surgical history on file.  Social History   Tobacco Use  . Smoking status: Never Smoker  . Smokeless tobacco: Current User    Types: Snuff  . Tobacco comment: occassional  Substance Use Topics  . Alcohol use: Yes    Alcohol/week: 0.0 standard drinks  . Drug use: No     Medication list has been reviewed and updated.  Current Meds  Medication Sig  . clonazePAM (KLONOPIN) 0.5 MG tablet Take 1 tablet (0.5 mg total) by mouth 3 (three) times daily as needed for anxiety.  . sertraline (ZOLOFT) 20 MG/ML concentrated solution Take 3 mLs (60 mg total) by mouth daily.    PHQ 2/9 Scores 04/02/2019 08/29/2017  PHQ - 2 Score 0 1  PHQ- 9 Score 3 -    BP Readings from Last 3 Encounters:  06/17/19 128/82  04/02/19 (!) 146/82  08/29/17 138/84    Physical Exam Vitals signs and nursing note reviewed.  HENT:     Head: Normocephalic.     Right Ear: Tympanic membrane, ear canal and external ear normal.     Left Ear: Tympanic membrane, ear canal and external ear normal.     Nose: Nose normal. No congestion or rhinorrhea.  Eyes:     General: Lids are normal. No visual field deficit or scleral icterus.       Right eye: No discharge.        Left eye: No discharge.     Conjunctiva/sclera: Conjunctivae normal.     Pupils: Pupils are equal, round, and reactive to light.     Funduscopic exam:  Right eye: No hemorrhage, AV nicking or arteriolar narrowing.        Left eye: No hemorrhage, AV nicking or arteriolar narrowing.  Neck:     Musculoskeletal: Normal range of motion and neck supple.     Thyroid: No thyromegaly.     Vascular: No JVD.     Trachea: No tracheal deviation.  Cardiovascular:     Rate and Rhythm: Normal rate and regular rhythm.     Heart sounds: Normal heart  sounds. No murmur. No friction rub. No gallop.   Pulmonary:     Effort: No respiratory distress.     Breath sounds: Normal breath sounds. No wheezing or rales.  Abdominal:     General: Bowel sounds are normal.     Palpations: Abdomen is soft. There is no mass.     Tenderness: There is no abdominal tenderness. There is no guarding or rebound.  Musculoskeletal: Normal range of motion.        General: No tenderness.  Lymphadenopathy:     Cervical: No cervical adenopathy.  Skin:    General: Skin is warm.     Findings: No rash.  Neurological:     Mental Status: He is alert and oriented to person, place, and time.     Cranial Nerves: Cranial nerves are intact. No cranial nerve deficit, dysarthria or facial asymmetry.     Sensory: Sensation is intact.     Motor: Motor function is intact.     Deep Tendon Reflexes: Reflexes are normal and symmetric.     Wt Readings from Last 3 Encounters:  06/17/19 211 lb (95.7 kg)  04/02/19 211 lb (95.7 kg)  08/29/17 208 lb (94.3 kg)    BP 128/82   Pulse 64   Ht 5\' 11"  (1.803 m)   Wt 211 lb (95.7 kg)   BMI 29.43 kg/m   Assessment and Plan: 1. Transient elevated blood pressure Patient experienced a transient elevation of his blood pressure when he was having to take Provigil per his psychiatrist.  Patient has since stopped it and there has been return of blood pressure to normal range is best he can tell on not sure if this is affected his blood pressure or if it may be due to his underlying anxiety the blood pressure seems to be normal today and we will continue to hold the Provigil.  Patient was given DASH diet to further enhance the decreasing of his blood pressure by natural means of avoiding excess sodium and other light foods.

## 2019-06-19 ENCOUNTER — Telehealth: Payer: Self-pay | Admitting: Psychiatry

## 2019-06-19 NOTE — Telephone Encounter (Signed)
Started the modafinil has not worked for him so far.  It is worth trying 1-1/2 each morning.  He has a history of Adderall abuse.  In addition he is taking clonazepam.  The cannot be combined with traditional stimulant.  Those 2 factors make the prescription of his traditional stimulant and possible in his case.  The other option I do not see in the chart is a trial of Strattera but most people do not find it any more effective than is modafinil.

## 2019-06-19 NOTE — Telephone Encounter (Signed)
Patient called and said that the modanifil is not working at all and that he would like to try a stimulant like adderal with his zoloft. Is that ok with you? Please give him a call at [phone].He said in the past he has not tken anti depressent with a stimulant

## 2019-06-22 ENCOUNTER — Other Ambulatory Visit: Payer: Self-pay | Admitting: Psychiatry

## 2019-06-22 ENCOUNTER — Telehealth: Payer: Self-pay | Admitting: Psychiatry

## 2019-06-22 MED ORDER — ATOMOXETINE HCL 25 MG PO CAPS: ORAL_CAPSULE | ORAL | 0 refills | Status: DC

## 2019-06-22 NOTE — Telephone Encounter (Signed)
Patient is complaining of ADD.  History of Adderall abuse.  History of taking Ritalin and ask for that.  Complained of poor response from modafinil.  Because of history of stimulant abuse will not prescribe Ritalin.  DC modafinil due to poor response  Okay trial of Strattera.  He will start with 25 mg and gradually increase to 100 mg daily.  It will not help until at least 3 to 4 weeks and may take as long as 6 weeks to help.  The primary side effect at the higher doses include sweating and tremor.  If he has problems we may have to use a lower dose that 100 mg is usually the most effective dose.  Prescription sent in for [facility]

## 2019-06-22 NOTE — Telephone Encounter (Signed)
Patient called and said that he will not take the straterra and refuses to fill the prescription. He wants to know if dr. Clovis Pu is going to let him try the ritalin or not and he would like to know either yes or no . So if it is no he can go else where. Please give him a call at [phone]

## 2019-06-22 NOTE — Telephone Encounter (Signed)
Pt called this AM to advise he had also taken Ritalin in the past and worked well.

## 2019-06-22 NOTE — Progress Notes (Signed)
Patient is complaining of ADD.  History of Adderall abuse.  History of taking Ritalin and ask for that.  Complained of poor response from modafinil.  Because of history of stimulant abuse will not prescribe Ritalin.  DC modafinil due to poor response  Okay trial of Strattera.  He will start with 25 mg and gradually increase to 100 mg daily.  It will not help until at least 3 to 4 weeks and may take as long as 6 weeks to help.  The primary side effect at the higher doses include sweating and tremor.  If he has problems we may have to use a lower dose that 100 mg is usually the most effective dose.

## 2019-06-22 NOTE — Telephone Encounter (Signed)
I will not prescribe any regular stimulant of any type to him.  No controlled substance stimulants.  If he chooses to go elsewhere then feel free to do so.

## 2019-06-23 NOTE — Telephone Encounter (Signed)
See new phone message, pt refusing to fill Strattera and wants to change doctors

## 2019-09-23 ENCOUNTER — Ambulatory Visit: Payer: 59 | Admitting: Psychiatry

## 2019-11-19 ENCOUNTER — Ambulatory Visit: Payer: Self-pay | Admitting: Family Medicine

## 2020-01-22 NOTE — Telephone Encounter (Signed)
ERROR

## 2020-02-12 ENCOUNTER — Encounter: Payer: Self-pay | Admitting: Family Medicine

## 2020-02-12 ENCOUNTER — Other Ambulatory Visit: Payer: Self-pay

## 2020-02-12 ENCOUNTER — Ambulatory Visit: Payer: 59 | Admitting: Family Medicine

## 2020-02-12 VITALS — BP 120/66 | HR 52 | Ht 71.0 in | Wt 206.0 lb

## 2020-02-12 DIAGNOSIS — R002 Palpitations: Secondary | ICD-10-CM

## 2020-02-12 NOTE — Progress Notes (Signed)
Date:  02/12/2020   Name:  David Graham   DOB:  05/28/1986   MRN:  [id number]   Chief Complaint: Follow-up (just wanted b/p checking)  Palpitations  This is a recurrent problem. The current episode started more than 1 year ago. The problem occurs intermittently. Progression since onset: nonsustained/likely PAT. Associated symptoms include an irregular heartbeat. Pertinent negatives include no anxiety, chest fullness, chest pain, coughing, diaphoresis, dizziness, fever, malaise/fatigue, nausea, near-syncope, numbness, shortness of breath, syncope, vomiting or weakness. The treatment provided mild relief. There is no history of anemia or drug use.    Lab Results  Component Value Date   CREATININE 1.06 04/02/2019   BUN 8 04/02/2019   NA 139 04/02/2019   K 4.4 04/02/2019   CL 100 04/02/2019   CO2 23 04/02/2019   Lab Results  Component Value Date   CHOL 220 (H) 04/02/2019   HDL 69 04/02/2019   LDLCALC 136 (H) 04/02/2019   TRIG 75 04/02/2019   Lab Results  Component Value Date   TSH 1.970 04/02/2019   No results found for: HGBA1C Lab Results  Component Value Date   WBC 3.4 04/02/2019   HGB 15.2 04/02/2019   HCT 43.2 04/02/2019   MCV 92 04/02/2019   PLT 136 (L) 04/02/2019   Lab Results  Component Value Date   ALT 26 04/02/2019   AST 22 04/02/2019   ALKPHOS 51 04/02/2019   BILITOT 0.6 04/02/2019     Review of Systems  Constitutional: Negative for chills, diaphoresis, fever and malaise/fatigue.  HENT: Negative for drooling, ear discharge, ear pain, postnasal drip and sore throat.   Respiratory: Negative for cough, shortness of breath and wheezing.   Cardiovascular: Positive for palpitations. Negative for chest pain, leg swelling, syncope and near-syncope.  Gastrointestinal: Negative for abdominal pain, blood in stool, constipation, diarrhea, nausea and vomiting.  Endocrine: Negative for polydipsia.  Genitourinary: Negative for dysuria, frequency, hematuria and  urgency.  Musculoskeletal: Negative for back pain, myalgias and neck pain.  Skin: Negative for rash.  Allergic/Immunologic: Negative for environmental allergies.  Neurological: Negative for dizziness, weakness, numbness and headaches.  Hematological: Does not bruise/bleed easily.  Psychiatric/Behavioral: Negative for suicidal ideas. The patient is not nervous/anxious.     Patient Active Problem List   Diagnosis Date Noted  . Panic disorder 08/03/2018  . Episodic mood disorder ([facility]) 08/03/2018    Allergies  Allergen Reactions  . Oxcarbazepine   . Viibryd [Vilazodone Hcl]     No past surgical history on file.  Social History   Tobacco Use  . Smoking status: Never Smoker  . Smokeless tobacco: Current User    Types: Snuff  . Tobacco comment: occassional  Substance Use Topics  . Alcohol use: Yes    Alcohol/week: 0.0 standard drinks  . Drug use: No     Medication list has been reviewed and updated.  Current Meds  Medication Sig  . clonazePAM (KLONOPIN) 0.5 MG tablet Take 1 tablet (0.5 mg total) by mouth 3 (three) times daily as needed for anxiety.  . methylphenidate (RITALIN) 20 MG tablet Take 10 mg by mouth 4 (four) times daily.  . [DISCONTINUED] sertraline (ZOLOFT) 20 MG/ML concentrated solution Take 3 mLs (60 mg total) by mouth daily.    PHQ 2/9 Scores 02/12/2020 04/02/2019 08/29/2017  PHQ - 2 Score 0 0 1  PHQ- 9 Score 0 3 -    BP Readings from Last 3 Encounters:  02/12/20 120/66  06/17/19 128/82  04/02/19 (!) 146/82  Physical Exam Vitals and nursing note reviewed.  HENT:     Head: Normocephalic.     Right Ear: Tympanic membrane, ear canal and external ear normal.     Left Ear: Tympanic membrane, ear canal and external ear normal.     Nose: Nose normal.  Eyes:     General: No scleral icterus.       Right eye: No discharge.        Left eye: No discharge.     Conjunctiva/sclera: Conjunctivae normal.     Pupils: Pupils are equal, round, and reactive to  light.  Neck:     Thyroid: No thyromegaly.     Vascular: No JVD.     Trachea: No tracheal deviation.  Cardiovascular:     Rate and Rhythm: Normal rate and regular rhythm.     Heart sounds: Normal heart sounds. No murmur. No friction rub. No gallop.   Pulmonary:     Effort: No respiratory distress.     Breath sounds: Normal breath sounds. No wheezing or rales.  Abdominal:     General: Bowel sounds are normal.     Palpations: Abdomen is soft. There is no mass.     Tenderness: There is no abdominal tenderness. There is no guarding or rebound.  Musculoskeletal:        General: No tenderness. Normal range of motion.     Cervical back: Normal range of motion and neck supple.  Lymphadenopathy:     Cervical: No cervical adenopathy.  Skin:    General: Skin is warm.     Findings: No rash.  Neurological:     Mental Status: He is alert and oriented to person, place, and time.     Cranial Nerves: No cranial nerve deficit.     Deep Tendon Reflexes: Reflexes are normal and symmetric.     Wt Readings from Last 3 Encounters:  02/12/20 206 lb (93.4 kg)  06/17/19 211 lb (95.7 kg)  04/02/19 211 lb (95.7 kg)    BP 120/66   Pulse (!) 52   Ht 5\' 11"  (1.803 m)   Wt 206 lb (93.4 kg)   BMI 28.73 kg/m   Assessment and Plan: 1. Intermittent palpitations Chronic.  Recurrent.  Episodic.  Often associated with caffeine and Ritalin.  Patient does relate that this is on an episodic basis and is nonsustained with only single beats and not on a regular basis.  Patient has been reassured that he has had these in the past and that I believe they have been associated with an occasional premature atrial beat.  This is nonsustained and that does not at this time warrant further evaluation.  Patient is encouraged to come back on an as-needed basis for recheck of blood pressure that he is also concerned about but is in excellent range today

## 2020-02-12 NOTE — Addendum Note (Signed)
Addended by: Everitt Amber on: 02/12/2020 01:14 PM   Modules accepted: Orders

## 2020-08-31 ENCOUNTER — Encounter: Payer: Self-pay | Admitting: Psychiatry

## 2020-08-31 ENCOUNTER — Ambulatory Visit (INDEPENDENT_AMBULATORY_CARE_PROVIDER_SITE_OTHER): Payer: 59 | Admitting: Psychiatry

## 2020-08-31 ENCOUNTER — Other Ambulatory Visit: Payer: Self-pay

## 2020-08-31 DIAGNOSIS — F39 Unspecified mood [affective] disorder: Secondary | ICD-10-CM

## 2020-08-31 DIAGNOSIS — F411 Generalized anxiety disorder: Secondary | ICD-10-CM | POA: Diagnosis not present

## 2020-08-31 DIAGNOSIS — F4001 Agoraphobia with panic disorder: Secondary | ICD-10-CM

## 2020-08-31 DIAGNOSIS — F902 Attention-deficit hyperactivity disorder, combined type: Secondary | ICD-10-CM

## 2020-08-31 DIAGNOSIS — Z8659 Personal history of other mental and behavioral disorders: Secondary | ICD-10-CM

## 2020-08-31 MED ORDER — METHYLPHENIDATE HCL 20 MG PO TABS: 20.0000 mg | ORAL_TABLET | Freq: Two times a day (BID) | ORAL | 0 refills | Status: DC

## 2020-08-31 MED ORDER — METHYLPHENIDATE HCL 20 MG PO TABS: 10.0000 mg | ORAL_TABLET | Freq: Four times a day (QID) | ORAL | 0 refills | Status: DC

## 2020-08-31 MED ORDER — CLONAZEPAM 0.5 MG PO TABS: 0.5000 mg | ORAL_TABLET | Freq: Three times a day (TID) | ORAL | 3 refills | Status: DC | PRN

## 2020-08-31 MED ORDER — SERTRALINE HCL 20 MG/ML PO CONC: 25.0000 mg | Freq: Every day | ORAL | 12 refills | Status: DC

## 2020-08-31 NOTE — Progress Notes (Signed)
David Levanndrew W Conley [id number] May 23, 1986 34 y.o.     Subjective:   Patient ID:  David Graham is a 34 y.o. (DOB May 23, 1986) male.  Chief Complaint:  Chief Complaint  Patient presents with  . Follow-up  . ADHD  . Anxiety    Anxiety Symptoms include decreased concentration. Patient reports no chest pain, confusion, nervous/anxious behavior, palpitations or suicidal ideas.     David Levanndrew W Bauserman  Follow up for intolerable anxiety.  AT visit 01/07/2019 and was to start sertraline solution.  Increased sertaline to 1.5 ml of Zoloft solution from 1ml.    His visit February 17, 2019.  His anxiety was significantly improved with the sertraline suspension at 30 mg daily.  Called 03/04/19 wanting to try buspirone and 10 mg BID sent in.  05/2019 visit with following noted: He felt it elevated anxiety and stopped it. Still on sertraline 30 mg suspension.  Klonopin 0.5 mg up to 3 daily but tries to limit it.  Average 2 daily.  He feels he can't go later.   Example meetings trigger anxiety. He doesn't see SE.  Taking more logical steps to get his life under better control and going pretty well.  Doesn't want to change meds. Anxiety has been alright and about the same as when last seen.   Feels he had less anxiety when he was taking Adderall.  He felt more confident.  Was more effective at work.  Was tested for ADD in the past and had a positive. 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  My mind was sharper than it is now. Asking for alternative ADD medication Not depressed.   No memory complaints.  Appetite is good.  Exercising.  Sleeping well.  Not having panic attacks now.  Has normal levels of anxiety and manages his anxiety much better. Plan: Start modafinil 200 mg tablet one half every morning for 7 days then 1 every morning if needed.  Do not exceed this recommended dose. Continue sertraline 30 mg daily Continue clonazepam but try to limit the dosage for cognitive reasons.   06/19/2019 phone call  from patient stating that the modafinil was not working and he wanted to try a stimulant like Adderall.  This was refused because he has a history of Adderall abuse.  The other option mentioned was Strattera.  He refused Strattera and stated he wanted to change doctors.  08/31/2020 appointment with the following noted: Not necessarily looking for med changes. As can be seen the patient has been instructed to follow-up 2 to 3 months after the September 2020 appointment but did not do so. PDMP indicates patient is getting methyl fendedate 20 mg tablets number 60/month and clonazepam 0.5 mg tablets number 90/month from another provider.  In September he had an Adderall prescription for 20 mg tablets number 60/month. Last filled Ritalin and lonazepam 0.5 #90 in 07/13/20. Decided to come back bc of cost reasons.  Had been [facility] NP in [place]. Taking clonazepam 0.5 mg TID, Ritalin 20 BID, Zoloft and they were charging for drugtests  Thinks Ritalin wears off in about 2 daily. Typically taking clonazepam 0.5 mg 2-3 daily.  If forgets bc would get withdrawal. Typically taking 40 mg Ritalin daily.  Denies abuse.  Nothing like taking Adderall. Zoloft 12.5 builds up in his system and then hazes his thinking.  So he can't take it every day Work same job for 7 mos and things going OK.  Life is alright. Manageable rare panic.  Not depressed.  Was having less anxiety until started working with coworker who is stressful.  Anxiety is not debilitating.   Provided documents from age 38 showing history of ADD and feels he needs Ritalin to pass the tests. Works for [employer] and subject to random drug tests.  No drug abuse.   Was getting drug tests with last provider.   Past psych med intolerances : Fluoxetine, Lexapro caused GI side effects, sertraline most ever ,  Viibryd allergic, duloxetine SE sexual, Pristiq Trileptal caused a rash, Depakote. Lamotrigine Vraylar worst one ever,  Gabapentin made him  feel weird.   Hx Adderall abuse.  Ritalin Modafinil NR Wellbutrin worst of all.   Xanax and Ativan poor response.  Review of Systems:  Review of Systems  Cardiovascular: Negative for chest pain and palpitations.  Neurological: Negative for tremors, weakness and headaches.  Psychiatric/Behavioral: Positive for decreased concentration. Negative for agitation, behavioral problems, confusion, dysphoric mood, hallucinations, self-injury, sleep disturbance and suicidal ideas. The patient is not nervous/anxious and is not hyperactive.     Medications: I have reviewed the patient's current medications.  Current Outpatient Medications  Medication Sig Dispense Refill  . sertraline (ZOLOFT) 50 MG tablet Take 12.5 mg by mouth daily.    . clonazePAM (KLONOPIN) 0.5 MG tablet Take 1 tablet (0.5 mg total) by mouth 3 (three) times daily as needed for anxiety. 90 tablet 3  . methylphenidate (RITALIN) 20 MG tablet Take 0.5 tablets (10 mg total) by mouth 4 (four) times daily. 60 tablet 0  . [START ON 09/28/2020] methylphenidate (RITALIN) 20 MG tablet Take 1 tablet (20 mg total) by mouth 2 (two) times daily. 60 tablet 0  . [START ON 10/26/2020] methylphenidate (RITALIN) 20 MG tablet Take 1 tablet (20 mg total) by mouth 2 (two) times daily. 60 tablet 0  . sertraline (ZOLOFT) 20 MG/ML concentrated solution Take 1.3 mLs (26 mg total) by mouth daily. 60 mL 12   No current facility-administered medications for this visit.    Medication Side Effects: mild delayed ejaculation. Not a big problem.  Night sweats resolved. Tends to have a lot of SE from meds.  Med sensitive.  Allergies:  Allergies  Allergen Reactions  . Oxcarbazepine   . Viibryd [Vilazodone Hcl]     Past Medical History:  Diagnosis Date  . ADHD (attention deficit hyperactivity disorder)   . Anxiety     History reviewed. No pertinent family history.  Social History   Socioeconomic History  . Marital status: Single    Spouse name: Not on  file  . Number of children: Not on file  . Years of education: Not on file  . Highest education level: Not on file  Occupational History  . Not on file  Tobacco Use  . Smoking status: Never Smoker  . Smokeless tobacco: Current User    Types: Snuff  . Tobacco comment: occassional  Substance and Sexual Activity  . Alcohol use: Yes    Alcohol/week: 0.0 standard drinks  . Drug use: No  . Sexual activity: Not Currently  Other Topics Concern  . Not on file  Social History Narrative  . Not on file   Social Determinants of Health   Financial Resource Strain: Not on file  Food Insecurity: Not on file  Transportation Needs: Not on file  Physical Activity: Not on file  Stress: Not on file  Social Connections: Not on file  Intimate Partner Violence: Not on file    Past Medical History, Surgical history, Social history, and Family  history were reviewed and updated as appropriate.   Please see review of systems for further details on the patient's review from today.   Objective:   Physical Exam:  There were no vitals taken for this visit.  Physical Exam Constitutional:      General: He is not in acute distress.    Appearance: He is well-developed.  Musculoskeletal:        General: No deformity.  Neurological:     Mental Status: He is alert and oriented to person, place, and time.     Coordination: Coordination normal.  Psychiatric:        Attention and Perception: He is inattentive.        Mood and Affect: Mood is anxious. Mood is not depressed. Affect is not labile, blunt, angry or inappropriate.        Speech: Speech normal.        Behavior: Behavior normal.        Thought Content: Thought content normal. Thought content does not include homicidal or suicidal ideation. Thought content does not include homicidal or suicidal plan.        Cognition and Memory: Cognition normal.        Judgment: Judgment normal.     Comments: Insight is improved but still somewhat  questionable      Lab Review:     Component Value Date/Time   NA 139 04/02/2019 1040   NA 136 09/25/2013 1151   K 4.4 04/02/2019 1040   K 4.1 09/25/2013 1151   CL 100 04/02/2019 1040   CL 103 09/25/2013 1151   CO2 23 04/02/2019 1040   CO2 31 09/25/2013 1151   GLUCOSE 96 04/02/2019 1040   GLUCOSE 75 09/25/2013 1151   BUN 8 04/02/2019 1040   BUN 13 09/25/2013 1151   CREATININE 1.06 04/02/2019 1040   CREATININE 1.10 09/25/2013 1151   CALCIUM 9.8 04/02/2019 1040   CALCIUM 9.7 09/25/2013 1151   PROT 7.0 04/02/2019 1040   PROT 8.4 (H) 09/25/2013 1151   ALBUMIN 4.8 04/02/2019 1040   ALBUMIN 4.4 09/25/2013 1151   AST 22 04/02/2019 1040   AST 37 09/25/2013 1151   ALT 26 04/02/2019 1040   ALT 32 09/25/2013 1151   ALKPHOS 51 04/02/2019 1040   ALKPHOS 81 09/25/2013 1151   BILITOT 0.6 04/02/2019 1040   BILITOT 0.6 09/25/2013 1151   GFRNONAA 93 04/02/2019 1040   GFRNONAA >60 09/25/2013 1151   GFRAA 108 04/02/2019 1040   GFRAA >60 09/25/2013 1151       Component Value Date/Time   WBC 3.4 04/02/2019 1040   WBC 6.0 09/25/2013 1151   RBC 4.70 04/02/2019 1040   RBC 5.34 09/25/2013 1151   HGB 15.2 04/02/2019 1040   HCT 43.2 04/02/2019 1040   PLT 136 (L) 04/02/2019 1040   MCV 92 04/02/2019 1040   MCV 89 09/25/2013 1151   MCH 32.3 04/02/2019 1040   MCH 32.0 09/25/2013 1151   MCHC 35.2 04/02/2019 1040   MCHC 35.8 09/25/2013 1151   RDW 12.9 04/02/2019 1040   RDW 12.7 09/25/2013 1151   LYMPHSABS 1.1 04/02/2019 1040   LYMPHSABS 1.5 09/25/2013 1151   MONOABS 0.7 09/25/2013 1151   EOSABS 0.2 04/02/2019 1040   EOSABS 0.3 09/25/2013 1151   BASOSABS 0.0 04/02/2019 1040   BASOSABS 0.1 09/25/2013 1151    No results found for: POCLITH, LITHIUM   No results found for: PHENYTOIN, PHENOBARB, VALPROATE, CBMZ   .res Assessment: Plan:  Attention deficit hyperactivity disorder (ADHD), combined type - Plan: methylphenidate (RITALIN) 20 MG tablet, methylphenidate (RITALIN) 20 MG  tablet, methylphenidate (RITALIN) 20 MG tablet  Panic disorder with agoraphobia - Plan: sertraline (ZOLOFT) 20 MG/ML concentrated solution, clonazePAM (KLONOPIN) 0.5 MG tablet  Generalized anxiety disorder - Plan: sertraline (ZOLOFT) 20 MG/ML concentrated solution  Episodic mood disorder (HCC)  History of major depression  Hx Adderall Abuse in remission Rule out OCD  Greater than 50% of 50-minute face to face time with patient was spent on counseling and coordination of care. We discussed the following: Overall Andrews anxiety is improved with ultra low dose sertraline.  He is satisfied with the response at this point..    Continue clonazepam 0.5 mg 3 times daily as needed.  Any generic except Actavis which works less well.  As the anxiety improves gradually reduce the dosage of clonazepam.  Encouraged is low the use of this is absolutely possible given his concerns about attention and focus.  Discussed how this can interfere with attention and focus even if he were to take a stimulant.  We discussed the short-term risks associated with benzodiazepines including sedation and increased fall risk among others.  Discussed long-term side effect risk including tolerance, dependence, potential withdrawal symptoms, and the potential eventual dose-related risk of dementia.  Disc risk with alcohol and do not combine them.  Extensive discussion about how it is not ideal to use a combination of stimulants plus benzodiazepines at the same time.  Patient has tried numerous traditional antianxiety strategies as noted above but has generally not tolerated SSRIs very well.  He is getting some benefit from ultra low-dose sertraline 12.5 mg daily.  He wishes to convert to liquid to make it more easy to adjust the dose between 8 and 10 mg daily is his perceived ideal dose.  We extensively discussed his history of Adderall abuse.  He has apparently been taking sertraline plus methylphenidate plus clonazepam from  another provider.  PDMP was reviewed and it appears he has been consistent and states that he does not have the same tendency to abuse methylphenidate as he has in the past abused Adderall.  He states he does not have the adverse reactions in his behavior in response to methylphenidate as opposed to Adderall.  He states he can comply with the methylphenidate.  States it is important for his job function to continue methylphenidate in order to pass required testing.  He provides documents dating back to age 79 of psychological testing that demonstrate ADHD.  He has not responded to alternative nonstimulant ADHD medications. He was instructed that if he starts to abuse methylphenidate or seek early refills that are inappropriate then methylphenidate will be discontinued.  He wishes to continue to have follow-up here.  We discussed the issues leading to his decision to seek care elsewhere and then to return to care here.  History of caffeine excess.   His panic disorder and generalized anxiety has largely been under control with the sertraline is no longer complaining of the side effects that he had in the past and he does not want dosage change.  He has had better response because he has been more compliant and consistent with the medication. He will have relapse if he discontinues these medications.  Continue sertraline 12.5 mg daily Continue clonazepam but try to limit the dosage for cognitive reasons.  Discussed withdrawal symptoms if he cuts the dose too quickly  Extensive review of genesight testing.  Follow-up 2-3 months  Meredith Staggers, MD, DFAPA   No future appointments.  No orders of the defined types were placed in this encounter.     -------------------------------

## 2020-11-03 ENCOUNTER — Telehealth: Payer: Self-pay | Admitting: Psychiatry

## 2020-11-03 NOTE — Telephone Encounter (Signed)
Patient called with concern about his Zoloft medication. He states that he experiencing a delayed orgasm, and it is becoming a problem in sexual activities. He also says that he read online that the medication Buspar when combined with the Zoloft will stop this problem. He would like to know if you would recommend him trying the Buspar.

## 2020-11-07 ENCOUNTER — Other Ambulatory Visit: Payer: Self-pay | Admitting: Psychiatry

## 2020-11-07 MED ORDER — BUSPIRONE HCL 10 MG PO TABS: ORAL_TABLET | ORAL | 1 refills | Status: DC

## 2020-11-07 NOTE — Telephone Encounter (Signed)
Please let him know that I agreed to the trial of the buspirone to reduce sexual side effects from sertraline.  He can try half a tablet twice daily.  It should work within a week or 2.  If it does not help then he can increase to 1 tablet twice daily.  Buspirone also has its on antianxiety effects which may be helpful as well for his overall anxiety level.

## 2020-11-08 ENCOUNTER — Encounter: Payer: Self-pay | Admitting: Psychiatry

## 2020-11-08 NOTE — Telephone Encounter (Signed)
Rtc to patient and instructed on the Buspar, he was very appreciative. Advised to call back with further issues.

## 2020-11-29 ENCOUNTER — Other Ambulatory Visit: Payer: Self-pay

## 2020-11-29 ENCOUNTER — Ambulatory Visit (INDEPENDENT_AMBULATORY_CARE_PROVIDER_SITE_OTHER): Payer: 59 | Admitting: Psychiatry

## 2020-11-29 ENCOUNTER — Encounter: Payer: Self-pay | Admitting: Psychiatry

## 2020-11-29 VITALS — BP 150/89 | HR 76

## 2020-11-29 DIAGNOSIS — F411 Generalized anxiety disorder: Secondary | ICD-10-CM | POA: Diagnosis not present

## 2020-11-29 DIAGNOSIS — F4001 Agoraphobia with panic disorder: Secondary | ICD-10-CM

## 2020-11-29 DIAGNOSIS — Z8659 Personal history of other mental and behavioral disorders: Secondary | ICD-10-CM

## 2020-11-29 DIAGNOSIS — F39 Unspecified mood [affective] disorder: Secondary | ICD-10-CM

## 2020-11-29 DIAGNOSIS — F15929 Other stimulant use, unspecified with intoxication, unspecified: Secondary | ICD-10-CM

## 2020-11-29 DIAGNOSIS — F902 Attention-deficit hyperactivity disorder, combined type: Secondary | ICD-10-CM

## 2020-11-29 MED ORDER — METHYLPHENIDATE HCL 20 MG PO TABS
10.0000 mg | ORAL_TABLET | Freq: Four times a day (QID) | ORAL | 0 refills | Status: DC
Start: 2020-11-29 — End: 2021-02-07

## 2020-11-29 MED ORDER — CLONAZEPAM 0.5 MG PO TABS: 0.5000 mg | ORAL_TABLET | Freq: Three times a day (TID) | ORAL | 3 refills | Status: DC | PRN

## 2020-11-29 MED ORDER — METHYLPHENIDATE HCL 20 MG PO TABS
20.0000 mg | ORAL_TABLET | Freq: Two times a day (BID) | ORAL | 0 refills | Status: DC
Start: 2020-12-27 — End: 2021-02-07

## 2020-11-29 MED ORDER — SERTRALINE HCL 20 MG/ML PO CONC
ORAL | 12 refills | Status: DC
Start: 2020-11-29 — End: 2020-11-30

## 2020-11-29 MED ORDER — METHYLPHENIDATE HCL 20 MG PO TABS: 20.0000 mg | ORAL_TABLET | Freq: Two times a day (BID) | ORAL | 0 refills | Status: DC

## 2020-11-29 NOTE — Progress Notes (Signed)
David Graham [id number] 05-Nov-1985 35 y.o.     Subjective:   Patient ID:  David Graham is a 35 y.o. (DOB [birth year]) male.  Chief Complaint:  Chief Complaint  Patient presents with  . Attention deficit hyperactivity disorder (ADHD), combined t  . Follow-up  . Anxiety  . Medication Problem    Anxiety Symptoms include decreased concentration. Patient reports no chest pain, confusion, nervous/anxious behavior, palpitations or suicidal ideas.     David Graham  Follow up for intolerable anxiety.  AT visit 01/07/2019 and was to start sertraline solution.  Increased sertaline to 1.5 ml of Zoloft solution from 48ml.    His visit February 17, 2019.  His anxiety was significantly improved with the sertraline suspension at 30 mg daily.  Called 03/04/19 wanting to try buspirone and 10 mg BID sent in.  05/2019 visit with following noted: He felt it elevated anxiety and stopped it. Still on sertraline 30 mg suspension.  Klonopin 0.5 mg up to 3 daily but tries to limit it.  Average 2 daily.  He feels he can't go later.   Example meetings trigger anxiety. He doesn't see SE.  Taking more logical steps to get his life under better control and going pretty well.  Doesn't want to change meds. Anxiety has been alright and about the same as when last seen.   Feels he had less anxiety when he was taking Adderall.  He felt more confident.  Was more effective at work.  Was tested for ADD in the past and had a positive. 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  My mind was sharper than it is now. Asking for alternative ADD medication Not depressed.   No memory complaints.  Appetite is good.  Exercising.  Sleeping well.  Not having panic attacks now.  Has normal levels of anxiety and manages his anxiety much better. Plan: Start modafinil 200 mg tablet one half every morning for 7 days then 1 every morning if needed.  Do not exceed this recommended dose. Continue sertraline 30 mg daily Continue  clonazepam but try to limit the dosage for cognitive reasons.   06/19/2019 phone call from patient stating that the modafinil was not working and he wanted to try a stimulant like Adderall.  This was refused because he has a history of Adderall abuse.  The other option mentioned was Strattera.  He refused Strattera and stated he wanted to change doctors.  08/31/2020 appointment with the following noted: Not necessarily looking for med changes. As can be seen the patient has been instructed to follow-up 2 to 3 months after the September 2020 appointment but did not do so. PDMP indicates patient is getting methyl fendedate 20 mg tablets number 60/month and clonazepam 0.5 mg tablets number 90/month from another provider.  In September he had an Adderall prescription for 20 mg tablets number 60/month. Last filled Ritalin and lonazepam 0.5 #90 in 07/13/20. Decided to come back bc of cost reasons.  Had been [name] NP in [place]. Taking clonazepam 0.5 mg TID, Ritalin 20 BID, Zoloft and they were charging for drugtests  Thinks Ritalin wears off in about 2 daily. Typically taking clonazepam 0.5 mg 2-3 daily.  If forgets bc would get withdrawal. Typically taking 40 mg Ritalin daily.  Denies abuse.  Nothing like taking Adderall. Zoloft 12.5 builds up in his system and then hazes his thinking.  So he can't take it every day Work same job for 7 mos and things going OK.  Life is alright. Manageable rare panic.  Not depressed.   Was having less anxiety until started working with coworker who is stressful.  Anxiety is not debilitating.   Provided documents from age 48 showing history of ADD and feels he needs Ritalin to pass the tests. Works for [employer] and subject to random drug tests.  No drug abuse.   Was getting drug tests with last provider. Plan: Continue sertraline 12.5 mg daily Continue clonazepam but try to limit the dosage for cognitive reasons.   11/03/2020 phone call from patient: Patient  called with concern about his Zoloft medication. He states that he experiencing a delayed orgasm, and it is becoming a problem in sexual activities. He also says that he read online that the medication Buspar when combined with the Zoloft will stop this problem. He would like to know if you would recommend him trying the Buspar. MD response: CC  5:49 PM Note Please let him know that I agreed to the trial of the buspirone to reduce sexual side effects from sertraline.  He can try half a tablet twice daily.  It should work within a week or 2.  If it does not help then he can increase to 1 tablet twice daily.  Buspirone also has its on antianxiety effects which may be helpful as well for his overall anxiety level.     11/29/2020 appointment with the following noted: Pretty good with anxierty and depression. Disc BP issues but took preworkout supplements and Ritalin and just came from the gym.  Did feel a little more benefit of sertraline by being more consistent.  Admits historically not consistent with sertraline but is now.  Now has less mood swings and irrtiablity by being more consistent.  1-2 mg increase is notable but causes  More sexual SE.   With buspirone didn't help the delayed orgasm.  Increased buspirone to 10 mg BID. No change in anxiety either.   Zoloft doesn't reduce libido but only orgasm.   Typically taking clonazepam TID for sleep and anxiety.    Past psych med intolerances : Fluoxetine, Lexapro caused GI side effects, sertraline most ever 50mg ,  Viibryd allergic, duloxetine SE sexual, Pristiq Trileptal caused a rash, Depakote. Lamotrigine Vraylar worst one ever,  Gabapentin made him feel weird.   Hx Adderall abuse.  Ritalin Modafinil NR Wellbutrin worst of all.   Xanax and Ativan poor response.  Review of Systems:  Review of Systems  Cardiovascular: Negative for chest pain and palpitations.  Neurological: Negative for tremors, weakness and headaches.   Psychiatric/Behavioral: Positive for decreased concentration. Negative for agitation, behavioral problems, confusion, dysphoric mood, hallucinations, self-injury, sleep disturbance and suicidal ideas. The patient is not nervous/anxious and is not hyperactive.     Medications: I have reviewed the patient's current medications.  Current Outpatient Medications  Medication Sig Dispense Refill  . busPIRone (BUSPAR) 10 MG tablet 1/2 tablet twice daily for 1 week then 1 twice daily 60 tablet 1  . clonazePAM (KLONOPIN) 0.5 MG tablet Take 1 tablet (0.5 mg total) by mouth 3 (three) times daily as needed for anxiety. 90 tablet 3  . methylphenidate (RITALIN) 20 MG tablet Take 0.5 tablets (10 mg total) by mouth 4 (four) times daily. 60 tablet 0  . [START ON 12/27/2020] methylphenidate (RITALIN) 20 MG tablet Take 1 tablet (20 mg total) by mouth 2 (two) times daily. 60 tablet 0  . [START ON 01/24/2021] methylphenidate (RITALIN) 20 MG tablet Take 1 tablet (20 mg total) by mouth  2 (two) times daily. 60 tablet 0  . sertraline (ZOLOFT) 20 MG/ML concentrated solution 1.3 ml daily 60 mL 12   No current facility-administered medications for this visit.    Medication Side Effects: mild delayed ejaculation. Not a big problem.  Night sweats resolved. Tends to have a lot of SE from meds.  Med sensitive.  Allergies:  Allergies  Allergen Reactions  . Oxcarbazepine   . Viibryd [Vilazodone Hcl]     Past Medical History:  Diagnosis Date  . ADHD (attention deficit hyperactivity disorder)   . Anxiety     History reviewed. No pertinent family history.  Social History   Socioeconomic History  . Marital status: Single    Spouse name: Not on file  . Number of children: Not on file  . Years of education: Not on file  . Highest education level: Not on file  Occupational History  . Not on file  Tobacco Use  . Smoking status: Never Smoker  . Smokeless tobacco: Current User    Types: Snuff  . Tobacco comment:  occassional  Substance and Sexual Activity  . Alcohol use: Yes    Alcohol/week: 0.0 standard drinks  . Drug use: No  . Sexual activity: Not Currently  Other Topics Concern  . Not on file  Social History Narrative  . Not on file   Social Determinants of Health   Financial Resource Strain: Not on file  Food Insecurity: Not on file  Transportation Needs: Not on file  Physical Activity: Not on file  Stress: Not on file  Social Connections: Not on file  Intimate Partner Violence: Not on file    Past Medical History, Surgical history, Social history, and Family history were reviewed and updated as appropriate.   Please see review of systems for further details on the patient's review from today.   Objective:   Physical Exam:  BP (!) 150/89   Pulse 76   Physical Exam Constitutional:      General: He is not in acute distress.    Appearance: He is well-developed.  Musculoskeletal:        General: No deformity.  Neurological:     Mental Status: He is alert and oriented to person, place, and time.     Coordination: Coordination normal.  Psychiatric:        Attention and Perception: He is inattentive.        Mood and Affect: Mood is anxious. Mood is not depressed. Affect is not labile, blunt, angry or inappropriate.        Speech: Speech normal.        Behavior: Behavior normal.        Thought Content: Thought content normal. Thought content does not include homicidal or suicidal ideation. Thought content does not include homicidal or suicidal plan.        Cognition and Memory: Cognition normal.        Judgment: Judgment normal.     Comments: Insight is improved and judgment better about use of meds and compliance.      Lab Review:     Component Value Date/Time   NA 139 04/02/2019 1040   NA 136 09/25/2013 1151   K 4.4 04/02/2019 1040   K 4.1 09/25/2013 1151   CL 100 04/02/2019 1040   CL 103 09/25/2013 1151   CO2 23 04/02/2019 1040   CO2 31 09/25/2013 1151    GLUCOSE 96 04/02/2019 1040   GLUCOSE 75 09/25/2013 1151   BUN 8 04/02/2019  1040   BUN 13 09/25/2013 1151   CREATININE 1.06 04/02/2019 1040   CREATININE 1.10 09/25/2013 1151   CALCIUM 9.8 04/02/2019 1040   CALCIUM 9.7 09/25/2013 1151   PROT 7.0 04/02/2019 1040   PROT 8.4 (H) 09/25/2013 1151   ALBUMIN 4.8 04/02/2019 1040   ALBUMIN 4.4 09/25/2013 1151   AST 22 04/02/2019 1040   AST 37 09/25/2013 1151   ALT 26 04/02/2019 1040   ALT 32 09/25/2013 1151   ALKPHOS 51 04/02/2019 1040   ALKPHOS 81 09/25/2013 1151   BILITOT 0.6 04/02/2019 1040   BILITOT 0.6 09/25/2013 1151   GFRNONAA 93 04/02/2019 1040   GFRNONAA >60 09/25/2013 1151   GFRAA 108 04/02/2019 1040   GFRAA >60 09/25/2013 1151       Component Value Date/Time   WBC 3.4 04/02/2019 1040   WBC 6.0 09/25/2013 1151   RBC 4.70 04/02/2019 1040   RBC 5.34 09/25/2013 1151   HGB 15.2 04/02/2019 1040   HCT 43.2 04/02/2019 1040   PLT 136 (L) 04/02/2019 1040   MCV 92 04/02/2019 1040   MCV 89 09/25/2013 1151   MCH 32.3 04/02/2019 1040   MCH 32.0 09/25/2013 1151   MCHC 35.2 04/02/2019 1040   MCHC 35.8 09/25/2013 1151   RDW 12.9 04/02/2019 1040   RDW 12.7 09/25/2013 1151   LYMPHSABS 1.1 04/02/2019 1040   LYMPHSABS 1.5 09/25/2013 1151   MONOABS 0.7 09/25/2013 1151   EOSABS 0.2 04/02/2019 1040   EOSABS 0.3 09/25/2013 1151   BASOSABS 0.0 04/02/2019 1040   BASOSABS 0.1 09/25/2013 1151    No results found for: POCLITH, LITHIUM   No results found for: PHENYTOIN, PHENOBARB, VALPROATE, CBMZ   .res Assessment: Plan:    Panic disorder with agoraphobia - Plan: clonazePAM (KLONOPIN) 0.5 MG tablet, sertraline (ZOLOFT) 20 MG/ML concentrated solution  Generalized anxiety disorder - Plan: sertraline (ZOLOFT) 20 MG/ML concentrated solution  Episodic mood disorder (HCC)  History of major depression  Attention deficit hyperactivity disorder (ADHD), combined type - Plan: methylphenidate (RITALIN) 20 MG tablet, methylphenidate (RITALIN)  20 MG tablet, methylphenidate (RITALIN) 20 MG tablet  Caffeine intoxication with complication (HCC)  Hx Adderall Abuse in remission Rule out OCD  Greater than 50% of 50-minute face to face time with patient was spent on counseling and coordination of care. We discussed the following: Overall Andrews anxiety is improved with ultra low dose sertraline.  He is satisfied with the response at this point.Marland Kitchen.   PDMP shows patient picking up clonazepam and methylphenidate monthly but no controlled substances from other providers.  Continue clonazepam 0.5 mg 3 times daily as needed.  Any generic except Actavis which works less well.  As the anxiety improves gradually reduce the dosage of clonazepam.  Encouraged is low the use of this is absolutely possible given his concerns about attention and focus.  Discussed how this can interfere with attention and focus even if he were to take a stimulant.  We discussed the short-term risks associated with benzodiazepines including sedation and increased fall risk among others.  Discussed long-term side effect risk including tolerance, dependence, potential withdrawal symptoms, and the potential eventual dose-related risk of dementia.  Disc risk with alcohol and do not combine them.  Extensive discussion about how it is not ideal to use a combination of stimulants plus benzodiazepines at the same time.  Patient has tried numerous traditional antianxiety strategies as noted above but has generally not tolerated SSRIs very well.  He is getting some benefit from ultra  low-dose sertraline 12.5 mg daily.  He wishes to convert to liquid to make it more easy to adjust the dose between 8 and 10 mg daily is his perceived ideal dose. Ritalin is effective and does not cause craving or abuse issue. Seems to reduce his anxiety.   We extensively discussed his history of Adderall abuse.  He has apparently been taking sertraline plus methylphenidate plus clonazepam from another  provider.  PDMP was reviewed and it appears he has been consistent and states that he does not have the same tendency to abuse methylphenidate as he has in the past abused Adderall.  He states he does not have the adverse reactions in his behavior in response to methylphenidate as opposed to Adderall.  He states he can comply with the methylphenidate.  States it is important for his job function to continue methylphenidate in order to pass required testing.  He provides documents dating back to age 19 of psychological testing that demonstrate ADHD.  He has not responded to alternative nonstimulant ADHD medications. He was instructed that if he starts to abuse methylphenidate or seek early refills that are inappropriate then methylphenidate will be discontinued.  He wishes to continue to have follow-up here.  We discussed the issues leading to his decision to seek care elsewhere and then to return to care here.  History of caffeine excess.   His panic disorder and generalized anxiety has largely been under control with the sertraline is no longer complaining of the side effects that he had in the past and he does not want dosage change.  He has had better response because he has been more compliant and consistent with the medication. He will have relapse if he discontinues these medications.  Continue sertraline 12.5 mg daily Continue clonazepam but try to limit the dosage for cognitive reasons.  Discussed withdrawal symptoms if he cuts the dose too quickly  Extensive discussion of this.  Experiment with buspirone timing and dose in order to try to reduce sexual SE.  He doesn't want to change sertraline.  Follow-up 3-4 months  Meredith Staggers, MD, DFAPA   No future appointments.  No orders of the defined types were placed in this encounter.     -------------------------------

## 2020-11-30 ENCOUNTER — Telehealth: Payer: Self-pay | Admitting: Psychiatry

## 2020-11-30 ENCOUNTER — Other Ambulatory Visit: Payer: Self-pay | Admitting: Psychiatry

## 2020-11-30 DIAGNOSIS — F4001 Agoraphobia with panic disorder: Secondary | ICD-10-CM

## 2020-11-30 DIAGNOSIS — F411 Generalized anxiety disorder: Secondary | ICD-10-CM

## 2020-11-30 MED ORDER — SERTRALINE HCL 20 MG/ML PO CONC
ORAL | 0 refills | Status: DC
Start: 2020-11-30 — End: 2021-02-07

## 2020-11-30 NOTE — Telephone Encounter (Signed)
Patient called in stating that he has new insurance, and [facility] will not longer accept his insurance for Sertraline. His other medications are still able to be sent there. He would like for the [facility] to [facility] to [facility] at [address]  [place], [place]. Also says that the insurance will only cover a 90 day prescription, and would like to know if its possible to get a prescription for that amount. Appt 6/29 Pls call [phone].

## 2020-11-30 NOTE — Telephone Encounter (Signed)
Pt informed

## 2020-11-30 NOTE — Telephone Encounter (Signed)
Let him know RX sent

## 2021-02-07 ENCOUNTER — Other Ambulatory Visit: Payer: Self-pay

## 2021-02-07 ENCOUNTER — Telehealth: Payer: Self-pay | Admitting: Psychiatry

## 2021-02-07 ENCOUNTER — Other Ambulatory Visit: Payer: Self-pay | Admitting: Psychiatry

## 2021-02-07 DIAGNOSIS — F411 Generalized anxiety disorder: Secondary | ICD-10-CM

## 2021-02-07 DIAGNOSIS — F4001 Agoraphobia with panic disorder: Secondary | ICD-10-CM

## 2021-02-07 DIAGNOSIS — F902 Attention-deficit hyperactivity disorder, combined type: Secondary | ICD-10-CM

## 2021-02-07 MED ORDER — METHYLPHENIDATE HCL 20 MG PO TABS: 20.0000 mg | ORAL_TABLET | Freq: Two times a day (BID) | ORAL | 0 refills | Status: DC

## 2021-02-07 MED ORDER — METHYLPHENIDATE HCL 20 MG PO TABS: 10.0000 mg | ORAL_TABLET | Freq: Four times a day (QID) | ORAL | 0 refills | Status: DC

## 2021-02-07 MED ORDER — BUSPIRONE HCL 10 MG PO TABS: 10.0000 mg | ORAL_TABLET | Freq: Two times a day (BID) | ORAL | 0 refills | Status: DC

## 2021-02-07 MED ORDER — METHYLPHENIDATE HCL 20 MG PO TABS
20.0000 mg | ORAL_TABLET | Freq: Two times a day (BID) | ORAL | 0 refills | Status: DC
Start: 2021-02-07 — End: 2021-03-15

## 2021-02-07 MED ORDER — SERTRALINE HCL 20 MG/ML PO CONC: ORAL | 0 refills | Status: DC

## 2021-02-07 NOTE — Telephone Encounter (Signed)
David Graham called in requesting refill for Buspar 10mg . He states he has changed his pharmacy to [facility] [address] [place]. He also states wanting refill for Ritalin 20mg  and Zoloft 20mg  although he doesn't need them until 6/12 and he does have enough to last him until than. He did also note that his insurance will only pay for three month supply for medication. Appt 6/29. Pls RTC [phone].

## 2021-02-07 NOTE — Telephone Encounter (Signed)
Buspar sent and ritalin pended.Please send the Zoloft it looks like it is a liquid form and Im not sure what the 90 day dose would be for that

## 2021-02-21 ENCOUNTER — Telehealth: Payer: Self-pay | Admitting: Psychiatry

## 2021-02-21 ENCOUNTER — Other Ambulatory Visit: Payer: Self-pay

## 2021-02-21 DIAGNOSIS — F902 Attention-deficit hyperactivity disorder, combined type: Secondary | ICD-10-CM

## 2021-02-21 NOTE — Telephone Encounter (Signed)
Informed pt to call pharmacy ,he has refills there already.

## 2021-02-21 NOTE — Telephone Encounter (Signed)
Pt would like a refill on Ritalin. [facility] [place].

## 2021-03-15 ENCOUNTER — Encounter: Payer: Self-pay | Admitting: Psychiatry

## 2021-03-15 ENCOUNTER — Other Ambulatory Visit: Payer: Self-pay

## 2021-03-15 ENCOUNTER — Ambulatory Visit: Payer: 59 | Admitting: Psychiatry

## 2021-03-15 VITALS — BP 130/76 | HR 60

## 2021-03-15 DIAGNOSIS — F39 Unspecified mood [affective] disorder: Secondary | ICD-10-CM

## 2021-03-15 DIAGNOSIS — F411 Generalized anxiety disorder: Secondary | ICD-10-CM | POA: Diagnosis not present

## 2021-03-15 DIAGNOSIS — F4001 Agoraphobia with panic disorder: Secondary | ICD-10-CM

## 2021-03-15 DIAGNOSIS — F902 Attention-deficit hyperactivity disorder, combined type: Secondary | ICD-10-CM

## 2021-03-15 DIAGNOSIS — F15929 Other stimulant use, unspecified with intoxication, unspecified: Secondary | ICD-10-CM

## 2021-03-15 MED ORDER — CLONAZEPAM 0.5 MG PO TABS: 0.5000 mg | ORAL_TABLET | Freq: Three times a day (TID) | ORAL | 5 refills | Status: DC | PRN

## 2021-03-15 MED ORDER — METHYLPHENIDATE HCL 20 MG PO TABS: 20.0000 mg | ORAL_TABLET | Freq: Two times a day (BID) | ORAL | 0 refills | Status: DC

## 2021-03-15 MED ORDER — METHYLPHENIDATE HCL 20 MG PO TABS: 10.0000 mg | ORAL_TABLET | Freq: Four times a day (QID) | ORAL | 0 refills | Status: DC

## 2021-03-15 MED ORDER — BUSPIRONE HCL 10 MG PO TABS: 10.0000 mg | ORAL_TABLET | Freq: Two times a day (BID) | ORAL | 0 refills | Status: DC

## 2021-03-15 MED ORDER — SERTRALINE HCL 20 MG/ML PO CONC: ORAL | 0 refills | Status: DC

## 2021-03-15 MED ORDER — METHYLPHENIDATE HCL 20 MG PO TABS
20.0000 mg | ORAL_TABLET | Freq: Two times a day (BID) | ORAL | 0 refills | Status: DC
Start: 2021-05-10 — End: 2021-04-20

## 2021-03-15 NOTE — Progress Notes (Signed)
David Graham [id number] [birth year] 35 y.o.     Subjective:   Patient ID:  David Graham is a 35 y.o. (DOB [birth year]) male.  Chief Complaint:  Chief Complaint  Patient presents with   Follow-up   ADHD   Anxiety    Anxiety Symptoms include decreased concentration. Patient reports no confusion, nervous/anxious behavior, palpitations or suicidal ideas.    David Graham  Follow up for intolerable anxiety.  AT visit 01/07/2019 and was to start sertraline solution.  Increased sertaline to 1.5 ml of Zoloft solution from 1ml.    His visit February 17, 2019.  His anxiety was significantly improved with the sertraline suspension at 30 mg daily.  Called 03/04/19 wanting to try buspirone and 10 mg BID sent in.  05/2019 visit with following noted: He felt it elevated anxiety and stopped it. Still on sertraline 30 mg suspension.  Klonopin 0.5 mg up to 3 daily but tries to limit it.  Average 2 daily.  He feels he can't go later.   Example meetings trigger anxiety. He doesn't see SE.  Taking more logical steps to get his life under better control and going pretty well.  Doesn't want to change meds. Anxiety has been alright and about the same as when last seen.   Feels he had less anxiety when he was taking Adderall.  He felt more confident.  Was more effective at work.  Was tested for ADD in the past and had a positive. 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  My mind was sharper than it is now. Asking for alternative ADD medication Not depressed.   No memory complaints.  Appetite is good.  Exercising.  Sleeping well.  Not having panic attacks now.  Has normal levels of anxiety and manages his anxiety much better. Plan: Start modafinil 200 mg tablet one half every morning for 7 days then 1 every morning if needed.  Do not exceed this recommended dose. Continue sertraline 30 mg daily Continue clonazepam but try to limit the dosage for cognitive reasons.   06/19/2019 phone call from patient  stating that the modafinil was not working and he wanted to try a stimulant like Adderall.  This was refused because he has a history of Adderall abuse.  The other option mentioned was Strattera.  He refused Strattera and stated he wanted to change doctors.  08/31/2020 appointment with the following noted: Not necessarily looking for med changes. As can be seen the patient has been instructed to follow-up 2 to 3 months after the September 2020 appointment but did not do so. PDMP indicates patient is getting methyl fendedate 20 mg tablets number 60/month and clonazepam 0.5 mg tablets number 90/month from another provider.  In September he had an Adderall prescription for 20 mg tablets number 60/month. Last filled Ritalin and lonazepam 0.5 #90 in 07/13/20. Decided to come back bc of cost reasons.  Had been [facility] [place] in [place]. Taking clonazepam 0.5 mg TID, Ritalin 20 BID, Zoloft and they were charging for drugtests  Thinks Ritalin wears off in about 2 daily. Typically taking clonazepam 0.5 mg 2-3 daily.  If forgets bc would get withdrawal. Typically taking 40 mg Ritalin daily.  Denies abuse.  Nothing like taking Adderall. Zoloft 12.5 builds up in his system and then hazes his thinking.  So he can't take it every day Work same job for 7 mos and things going OK.  Life is alright. Manageable rare panic.  Not depressed.   Was  having less anxiety until started working with coworker who is stressful.  Anxiety is not debilitating.   Provided documents from age 20 showing history of ADD and feels he needs Ritalin to pass the tests. Works for [employer] and subject to random drug tests.  No drug abuse.   Was getting drug tests with last provider. Plan: Continue sertraline 12.5 mg daily Continue clonazepam but try to limit the dosage for cognitive reasons.   11/03/2020 phone call from patient: Patient called with concern about his Zoloft medication. He states that he experiencing a delayed orgasm, and  it is becoming a problem in sexual activities. He also says that he read online that the medication Buspar when combined with the Zoloft will stop this problem. He would like to know if you would recommend him trying the Buspar. MD response: CC   5:49 PM Note Please let him know that I agreed to the trial of the buspirone to reduce sexual side effects from sertraline.  He can try half a tablet twice daily.  It should work within a week or 2.  If it does not help then he can increase to 1 tablet twice daily.  Buspirone also has its on antianxiety effects which may be helpful as well for his overall anxiety level.     11/29/2020 appointment with the following noted: Pretty good with anxierty and depression. Disc BP issues but took preworkout supplements and Ritalin and just came from the gym.  Did feel a little more benefit of sertraline by being more consistent.  Admits historically not consistent with sertraline but is now.  Now has less mood swings and irrtiablity by being more consistent.  1-2 mg increase is notable but causes  More sexual SE.   With buspirone didn't help the delayed orgasm.  Increased buspirone to 10 mg BID. No change in anxiety either.   Zoloft doesn't reduce libido but only orgasm.   Typically taking clonazepam TID for sleep and anxiety.  Plan no changes  03/15/21 appt noted: Taking sertraline about 10-15 mg he thinks or about 1/2 ml. Pretty good overall but working a lot. Still takes Ritalin 20 BID usually which is pretty mild.  Takes it daily. Takes clonazepam TID still.   Takes buspirone prn. Sexual Se sertraline but not anything else. Sleep is good.   [place] stopped drinking 3 weeks ago and started to feel better.  Was drinking regularly before. BP went up and now back to normal. No sig avoidance. No DUI but family was concerned about his drinking.   Past psych med intolerances : Fluoxetine, Lexapro caused GI side effects, sertraline most ever 50mg ,  Viibryd  allergic, duloxetine SE sexual, Pristiq Trileptal caused a rash, Depakote. Lamotrigine Vraylar worst one ever,  Gabapentin made him feel weird.   Hx Adderall abuse.  Ritalin Modafinil NR Wellbutrin worst of all.   Xanax and Ativan poor response.  Review of Systems:  Review of Systems  Cardiovascular:  Negative for palpitations.  Neurological:  Negative for tremors, weakness and headaches.  Psychiatric/Behavioral:  Positive for decreased concentration. Negative for agitation, behavioral problems, confusion, dysphoric mood, hallucinations, self-injury, sleep disturbance and suicidal ideas. The patient is not nervous/anxious and is not hyperactive.    Medications: I have reviewed the patient's current medications.  Current Outpatient Medications  Medication Sig Dispense Refill   busPIRone (BUSPAR) 10 MG tablet Take 1 tablet (10 mg total) by mouth 2 (two) times daily. 180 tablet 0   clonazePAM (KLONOPIN) 0.5  MG tablet Take 1 tablet (0.5 mg total) by mouth 3 (three) times daily as needed for anxiety. 90 tablet 5   [START ON 04/04/2021] methylphenidate (RITALIN) 20 MG tablet Take 0.5 tablets (10 mg total) by mouth 4 (four) times daily. 60 tablet 0   [START ON 04/12/2021] methylphenidate (RITALIN) 20 MG tablet Take 1 tablet (20 mg total) by mouth 2 (two) times daily. 60 tablet 0   [START ON 05/10/2021] methylphenidate (RITALIN) 20 MG tablet Take 1 tablet (20 mg total) by mouth 2 (two) times daily. 60 tablet 0   sertraline (ZOLOFT) 20 MG/ML concentrated solution 1.3 ml daily 180 mL 0   No current facility-administered medications for this visit.    Medication Side Effects: mild delayed ejaculation. Not a big problem.  Night sweats resolved. Tends to have a lot of SE from meds.  Med sensitive.  Allergies:  Allergies  Allergen Reactions   Oxcarbazepine    Viibryd [Vilazodone Hcl]     Past Medical History:  Diagnosis Date   ADHD (attention deficit hyperactivity disorder)    Anxiety      History reviewed. No pertinent family history.  Social History   Socioeconomic History   Marital status: Single    Spouse name: Not on file   Number of children: Not on file   Years of education: Not on file   Highest education level: Not on file  Occupational History   Not on file  Tobacco Use   Smoking status: Never   Smokeless tobacco: Current    Types: Snuff   Tobacco comments:    occassional  Substance and Sexual Activity   Alcohol use: Yes    Alcohol/week: 0.0 standard drinks   Drug use: No   Sexual activity: Not Currently  Other Topics Concern   Not on file  Social History Narrative   Not on file   Social Determinants of Health   Financial Resource Strain: Not on file  Food Insecurity: Not on file  Transportation Needs: Not on file  Physical Activity: Not on file  Stress: Not on file  Social Connections: Not on file  Intimate Partner Violence: Not on file    Past Medical History, Surgical history, Social history, and Family history were reviewed and updated as appropriate.   Please see review of systems for further details on the patient's review from today.   Objective:   Physical Exam:  BP 130/76   Pulse 60   Physical Exam Constitutional:      General: He is not in acute distress.    Appearance: He is well-developed.  Musculoskeletal:        General: No deformity.  Neurological:     Mental Status: He is alert and oriented to person, place, and time.     Coordination: Coordination normal.  Psychiatric:        Attention and Perception: He is attentive.        Mood and Affect: Mood is anxious. Mood is not depressed. Affect is not labile, blunt, angry or inappropriate.        Speech: Speech normal.        Behavior: Behavior normal.        Thought Content: Thought content normal. Thought content does not include homicidal or suicidal ideation. Thought content does not include homicidal or suicidal plan.        Cognition and Memory: Cognition  normal.        Judgment: Judgment normal.     Comments: Insight is  improved and judgment better about use of meds and compliance.     Lab Review:     Component Value Date/Time   NA 139 04/02/2019 1040   NA 136 09/25/2013 1151   K 4.4 04/02/2019 1040   K 4.1 09/25/2013 1151   CL 100 04/02/2019 1040   CL 103 09/25/2013 1151   CO2 23 04/02/2019 1040   CO2 31 09/25/2013 1151   GLUCOSE 96 04/02/2019 1040   GLUCOSE 75 09/25/2013 1151   BUN 8 04/02/2019 1040   BUN 13 09/25/2013 1151   CREATININE 1.06 04/02/2019 1040   CREATININE 1.10 09/25/2013 1151   CALCIUM 9.8 04/02/2019 1040   CALCIUM 9.7 09/25/2013 1151   PROT 7.0 04/02/2019 1040   PROT 8.4 (H) 09/25/2013 1151   ALBUMIN 4.8 04/02/2019 1040   ALBUMIN 4.4 09/25/2013 1151   AST 22 04/02/2019 1040   AST 37 09/25/2013 1151   ALT 26 04/02/2019 1040   ALT 32 09/25/2013 1151   ALKPHOS 51 04/02/2019 1040   ALKPHOS 81 09/25/2013 1151   BILITOT 0.6 04/02/2019 1040   BILITOT 0.6 09/25/2013 1151   GFRNONAA 93 04/02/2019 1040   GFRNONAA >60 09/25/2013 1151   GFRAA 108 04/02/2019 1040   GFRAA >60 09/25/2013 1151       Component Value Date/Time   WBC 3.4 04/02/2019 1040   WBC 6.0 09/25/2013 1151   RBC 4.70 04/02/2019 1040   RBC 5.34 09/25/2013 1151   HGB 15.2 04/02/2019 1040   HCT 43.2 04/02/2019 1040   PLT 136 (L) 04/02/2019 1040   MCV 92 04/02/2019 1040   MCV 89 09/25/2013 1151   MCH 32.3 04/02/2019 1040   MCH 32.0 09/25/2013 1151   MCHC 35.2 04/02/2019 1040   MCHC 35.8 09/25/2013 1151   RDW 12.9 04/02/2019 1040   RDW 12.7 09/25/2013 1151   LYMPHSABS 1.1 04/02/2019 1040   LYMPHSABS 1.5 09/25/2013 1151   MONOABS 0.7 09/25/2013 1151   EOSABS 0.2 04/02/2019 1040   EOSABS 0.3 09/25/2013 1151   BASOSABS 0.0 04/02/2019 1040   BASOSABS 0.1 09/25/2013 1151    No results found for: POCLITH, LITHIUM   No results found for: PHENYTOIN, PHENOBARB, VALPROATE, CBMZ   .res Assessment: Plan:    Panic disorder with  agoraphobia - Plan: clonazePAM (KLONOPIN) 0.5 MG tablet, sertraline (ZOLOFT) 20 MG/ML concentrated solution  Generalized anxiety disorder - Plan: busPIRone (BUSPAR) 10 MG tablet, sertraline (ZOLOFT) 20 MG/ML concentrated solution  Episodic mood disorder (HCC)  Attention deficit hyperactivity disorder (ADHD), combined type - Plan: methylphenidate (RITALIN) 20 MG tablet, methylphenidate (RITALIN) 20 MG tablet, methylphenidate (RITALIN) 20 MG tablet  Caffeine intoxication with complication (HCC)  Hx Adderall Abuse in remission Rule out OCD  Greater than 50% of 30-minute face to face time with patient was spent on counseling and coordination of care. We discussed the following: Overall Andrews anxiety is improved with ultra low dose sertraline.  He is satisfied with the response at this point.Marland Kitchen   PDMP shows patient picking up clonazepam and methylphenidate monthly but no controlled substances from other providers.  Continue clonazepam 0.5 mg 3 times daily as needed.  Any generic except Actavis which works less well.  As the anxiety improves gradually reduce the dosage of clonazepam.  Encouraged is low the use of this is absolutely possible given his concerns about attention and focus.  Discussed how this can interfere with attention and focus even if he were to take a stimulant.  We discussed the short-term risks associated  with benzodiazepines including sedation and increased fall risk among others.  Discussed long-term side effect risk including tolerance, dependence, potential withdrawal symptoms, and the potential eventual dose-related risk of dementia.  Disc risk with alcohol and do not combine them.  Extensive discussion about how it is not ideal to use a combination of stimulants plus benzodiazepines at the same time.  Patient has tried numerous traditional antianxiety strategies as noted above but has generally not tolerated SSRIs very well.  He is getting some benefit from ultra low-dose  sertraline 12.5 mg daily.  He wishes to convert to liquid to make it more easy to adjust the dose between 8 and 10 mg daily is his perceived ideal dose. Ritalin is effective and does not cause craving or abuse issue. Seems to reduce his anxiety.   We extensively discussed his history of Adderall abuse.  He has apparently been taking sertraline plus methylphenidate plus clonazepam from another provider.  PDMP was reviewed and it appears he has been consistent and states that he does not have the same tendency to abuse methylphenidate as he has in the past abused Adderall.  He states he does not have the adverse reactions in his behavior in response to methylphenidate as opposed to Adderall.  He states he can comply with the methylphenidate.  States it is important for his job function to continue methylphenidate in order to pass required testing.  He provides documents dating back to age 47 of psychological testing that demonstrate ADHD.  He has not responded to alternative nonstimulant ADHD medications. He was instructed that if he starts to abuse methylphenidate or seek early refills that are inappropriate then methylphenidate will be discontinued.  He wishes to continue to have follow-up here.  We discussed the issues leading to his decision to seek care elsewhere and then to return to care here.  History of caffeine excess.   His panic disorder and generalized anxiety has largely been under control with the sertraline is no longer complaining of the side effects that he had in the past and he does not want dosage change.  He has had better response because he has been more compliant and consistent with the medication. He will have relapse if he discontinues these medications.  Continue sertraline 12.5 mg daily. He's approximating low dose DT sexual SE Continue clonazepam but try to limit the dosage for cognitive reasons.  Discussed withdrawal symptoms if he cuts the dose too quickly  Extensive  discussion of this.  Experiment with buspirone timing and dose in order to try to reduce sexual SE.  He doesn't want to change sertraline.  Follow-up 3-4 months  Meredith Staggers, MD, DFAPA   No future appointments.  No orders of the defined types were placed in this encounter.     -------------------------------

## 2021-03-31 ENCOUNTER — Telehealth: Payer: Self-pay | Admitting: Psychiatry

## 2021-03-31 NOTE — Telephone Encounter (Signed)
Please review

## 2021-03-31 NOTE — Telephone Encounter (Signed)
Next visit is 08/15/21. David Graham states he has been on Zoloft for awhile and is unable to have an orgasm with his girlfriend. He wonders if Remeron would have less side effects and perhaps let him achieve an orgasm? His number is [phone].

## 2021-04-01 NOTE — Telephone Encounter (Signed)
He is extremely med sensitive.  Tell him to see if he can tolerate mirtazapine 1/2 of 7.5 mg tablet at night (Remeron).  If so he can reduce sertraline dose by about 30% and see if that solves the problem.  He is using liquid sertraline.

## 2021-04-03 ENCOUNTER — Other Ambulatory Visit: Payer: Self-pay

## 2021-04-03 MED ORDER — MIRTAZAPINE 7.5 MG PO TABS: 3.7500 mg | ORAL_TABLET | Freq: Every day | ORAL | 0 refills | Status: DC

## 2021-04-03 NOTE — Telephone Encounter (Signed)
Khani called back to see what Dr. Jennelle Human about him taking the Remeron.  I told him what Dr. Jennelle Human advised. He said a prescription for the Remeron will need to be called in.  [facility] [place].  Please call him to review the instructions and to let him know the prescription has been sent.

## 2021-04-03 NOTE — Telephone Encounter (Signed)
Rx sent 

## 2021-04-05 ENCOUNTER — Other Ambulatory Visit: Payer: Self-pay

## 2021-04-05 DIAGNOSIS — F411 Generalized anxiety disorder: Secondary | ICD-10-CM

## 2021-04-05 DIAGNOSIS — F4001 Agoraphobia with panic disorder: Secondary | ICD-10-CM

## 2021-04-10 ENCOUNTER — Telehealth: Payer: Self-pay | Admitting: Psychiatry

## 2021-04-10 NOTE — Telephone Encounter (Signed)
Pt reporting he's taking REMERON 7.5 mg and tolerating well asking to increase to next dose. [facility] [place]. Pt # [phone]

## 2021-04-10 NOTE — Telephone Encounter (Signed)
Pt started with 1/2 tablet of 7.5 mg Remeron, then increased to 7.5 mg, now asking to increase to next dose? 15 mg?  He's medication sensitive but tolerating with no problems.

## 2021-04-12 ENCOUNTER — Other Ambulatory Visit: Payer: Self-pay

## 2021-04-12 ENCOUNTER — Ambulatory Visit (INDEPENDENT_AMBULATORY_CARE_PROVIDER_SITE_OTHER): Payer: 59 | Admitting: Family Medicine

## 2021-04-12 ENCOUNTER — Encounter: Payer: Self-pay | Admitting: Family Medicine

## 2021-04-12 VITALS — BP 138/84 | HR 68 | Ht 71.0 in | Wt 223.0 lb

## 2021-04-12 DIAGNOSIS — N522 Drug-induced erectile dysfunction: Secondary | ICD-10-CM | POA: Diagnosis not present

## 2021-04-12 DIAGNOSIS — Z0000 Encounter for general adult medical examination without abnormal findings: Secondary | ICD-10-CM

## 2021-04-12 LAB — HEMOCCULT GUIAC POC 1CARD (OFFICE): Fecal Occult Blood, POC: NEGATIVE

## 2021-04-12 MED ORDER — MIRTAZAPINE 15 MG PO TABS: 15.0000 mg | ORAL_TABLET | Freq: Every day | ORAL | 0 refills | Status: DC

## 2021-04-12 MED ORDER — TADALAFIL 5 MG PO TABS: 5.0000 mg | ORAL_TABLET | Freq: Every day | ORAL | 0 refills | Status: DC | PRN

## 2021-04-12 NOTE — Telephone Encounter (Signed)
Attempted to call patient, mailbox full.

## 2021-04-12 NOTE — Telephone Encounter (Signed)
Rx sent and pt informed  

## 2021-04-12 NOTE — Telephone Encounter (Signed)
OK increase to 15mg  mirtazapine

## 2021-04-12 NOTE — Progress Notes (Signed)
Date:  04/12/2021   Name:  David Graham   DOB:  1986-08-09   MRN:  [id number]   Chief Complaint: Annual Exam  Patient is a 35 year old male who presents for a comprehensive physical exam. The patient reports the following problems: ed. Health maintenance has been reviewed up to date.     Lab Results  Component Value Date   CREATININE 1.06 04/02/2019   BUN 8 04/02/2019   NA 139 04/02/2019   K 4.4 04/02/2019   CL 100 04/02/2019   CO2 23 04/02/2019   Lab Results  Component Value Date   CHOL 220 (H) 04/02/2019   HDL 69 04/02/2019   LDLCALC 136 (H) 04/02/2019   TRIG 75 04/02/2019   Lab Results  Component Value Date   TSH 1.970 04/02/2019   No results found for: HGBA1C Lab Results  Component Value Date   WBC 3.4 04/02/2019   HGB 15.2 04/02/2019   HCT 43.2 04/02/2019   MCV 92 04/02/2019   PLT 136 (L) 04/02/2019   Lab Results  Component Value Date   ALT 26 04/02/2019   AST 22 04/02/2019   ALKPHOS 51 04/02/2019   BILITOT 0.6 04/02/2019     Review of Systems  Constitutional:  Negative for chills and fever.  HENT:  Negative for drooling, ear discharge, ear pain and sore throat.   Respiratory:  Negative for cough, shortness of breath and wheezing.   Cardiovascular:  Negative for chest pain, palpitations and leg swelling.  Gastrointestinal:  Negative for abdominal pain, blood in stool, constipation, diarrhea and nausea.  Endocrine: Negative for polydipsia.  Genitourinary:  Negative for dysuria, frequency, hematuria and urgency.  Musculoskeletal:  Negative for back pain, myalgias and neck pain.  Skin:  Negative for rash.  Allergic/Immunologic: Negative for environmental allergies.  Neurological:  Negative for dizziness and headaches.  Hematological:  Does not bruise/bleed easily.  Psychiatric/Behavioral:  Negative for suicidal ideas. The patient is not nervous/anxious.    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  Social History   Tobacco Use   Smoking status: Never   Smokeless tobacco: Current    Types: Snuff   Tobacco comments:    occassional  Substance Use Topics   Alcohol use: Yes    Alcohol/week: 0.0 standard drinks   Drug use: No     Medication list has been reviewed and updated.  Current Meds  Medication Sig   clonazePAM (KLONOPIN) 0.5 MG tablet Take 1 tablet (0.5 mg total) by mouth 3 (three) times daily as needed for anxiety.   methylphenidate (RITALIN) 20 MG tablet Take 1 tablet (20 mg total) by mouth 2 (two) times daily.   [START ON 05/10/2021] methylphenidate (RITALIN) 20 MG tablet Take 1 tablet (20 mg total) by mouth 2 (two) times daily.   mirtazapine (REMERON) 7.5 MG tablet Take 0.5 tablets (3.75 mg total) by mouth at bedtime. (Patient taking differently: Take 7.5 mg by mouth at bedtime.)   [DISCONTINUED] busPIRone (BUSPAR) 10 MG tablet Take 1 tablet (10 mg total) by mouth 2 (two) times daily.   [DISCONTINUED] methylphenidate (RITALIN) 20 MG tablet Take 0.5 tablets (10 mg total) by mouth 4 (four) times daily.    PHQ 2/9 Scores 04/12/2021 02/12/2020 04/02/2019 08/29/2017  PHQ - 2 Score 0 0 0 1  PHQ- 9 Score 0 0 3 -    GAD 7 : Generalized Anxiety Score 04/12/2021 02/12/2020  Nervous, Anxious, on Edge 0 1  Control/stop worrying 0 1  Worry too much - different things 0 1  Trouble relaxing 0 1  Restless 0 0  Easily annoyed or irritable 0 1  Afraid - awful might happen 0 0  Total GAD 7 Score 0 5  Anxiety Difficulty - Not difficult at all    BP Readings from Last 3 Encounters:  04/12/21 138/84  02/12/20 120/66  06/17/19 128/82    Physical Exam Vitals and nursing note reviewed.  Constitutional:      Appearance: He is well-developed and normal weight.  HENT:     Head: Normocephalic.     Jaw: There is normal jaw  occlusion.     Salivary Glands: Right salivary gland is not diffusely enlarged or tender. Left salivary gland is not diffusely enlarged or tender.     Right Ear: Hearing, tympanic membrane, ear canal and external ear normal.     Left Ear: Hearing, tympanic membrane, ear canal and external ear normal.     Nose: Nose normal. No congestion or rhinorrhea.     Right Turbinates: Not enlarged, swollen or pale.     Left Turbinates: Not enlarged, swollen or pale.     Right Sinus: No maxillary sinus tenderness or frontal sinus tenderness.     Left Sinus: No maxillary sinus tenderness or frontal sinus tenderness.     Mouth/Throat:     Lips: Pink.     Mouth: Mucous membranes are moist.     Dentition: Normal dentition.     Tongue: No lesions. Tongue does not deviate from midline.     Palate: No mass and lesions.     Pharynx: Oropharynx is clear.     Tonsils: No tonsillar exudate.  Eyes:     General: Lids are normal. Vision grossly intact. Gaze aligned appropriately. No visual field deficit or scleral icterus.       Right eye: No discharge.        Left eye: No discharge.     Extraocular Movements: Extraocular movements intact.     Conjunctiva/sclera: Conjunctivae normal.     Pupils: Pupils are equal, round, and reactive to light.     Funduscopic exam:    Right eye: Red reflex present.        Left eye: Red reflex present. Neck:     Thyroid: No thyroid mass, thyromegaly or thyroid tenderness.     Vascular: Normal carotid pulses. No carotid bruit, hepatojugular reflux or JVD.     Trachea: Trachea and phonation normal. No abnormal tracheal secretions or tracheal deviation.  Cardiovascular:     Rate and Rhythm: Normal rate and regular rhythm.     Chest Wall: PMI is not displaced.     Pulses: Normal pulses.          Carotid pulses are 2+ on the right side and 2+ on the left side.      Radial pulses are 2+ on the right side and 2+ on the left side.       Femoral pulses are 2+ on the right side and 2+  on the left side.      Popliteal pulses are 2+ on the right side and 2+ on the left side.       Dorsalis pedis pulses are 2+ on the right side and  2+ on the left side.       Posterior tibial pulses are 2+ on the right side and 2+ on the left side.     Heart sounds: Normal heart sounds, S1 normal and S2 normal. No murmur heard. No systolic murmur is present.  No diastolic murmur is present.    No friction rub. No gallop. No S3 or S4 sounds.  Pulmonary:     Effort: Pulmonary effort is normal. No respiratory distress.     Breath sounds: Normal breath sounds. No decreased breath sounds, wheezing, rhonchi or rales.  Chest:  Breasts:    Breasts are symmetrical.     Right: Normal. No swelling, bleeding, inverted nipple, mass, axillary adenopathy or supraclavicular adenopathy.     Left: Normal. No swelling, bleeding, inverted nipple, mass, axillary adenopathy or supraclavicular adenopathy.  Abdominal:     General: Bowel sounds are normal.     Palpations: Abdomen is soft. There is no hepatomegaly, splenomegaly or mass.     Tenderness: There is no abdominal tenderness. There is no guarding or rebound.  Genitourinary:    Penis: Normal and circumcised.      Testes: Normal.        Right: Mass, tenderness, swelling, testicular hydrocele or varicocele not present. Right testis is descended.        Left: Mass, tenderness, swelling, testicular hydrocele or varicocele not present. Left testis is descended.     Epididymis:     Right: Normal.     Left: Normal.     Prostate: Normal. Not enlarged, not tender and no nodules present.     Rectum: Normal. Guaiac result negative. No mass.  Musculoskeletal:        General: No tenderness. Normal range of motion.     Cervical back: Full passive range of motion without pain, normal range of motion and neck supple.  Lymphadenopathy:     Head:     Right side of head: No submental or submandibular adenopathy.     Left side of head: No submental or submandibular  adenopathy.     Cervical: No cervical adenopathy.     Right cervical: No superficial, deep or posterior cervical adenopathy.    Left cervical: No superficial, deep or posterior cervical adenopathy.     Upper Body:     Right upper body: No supraclavicular or axillary adenopathy.     Left upper body: No supraclavicular or axillary adenopathy.  Skin:    General: Skin is warm.     Capillary Refill: Capillary refill takes less than 2 seconds.     Findings: No rash.  Neurological:     Mental Status: He is alert and oriented to person, place, and time.     Cranial Nerves: Cranial nerves are intact. No cranial nerve deficit or facial asymmetry.     Sensory: Sensation is intact.     Motor: Motor function is intact.     Coordination: Coordination is intact.     Deep Tendon Reflexes: Reflexes are normal and symmetric.     Reflex Scores:      Tricep reflexes are 2+ on the right side and 2+ on the left side.      Bicep reflexes are 2+ on the right side and 2+ on the left side.      Brachioradialis reflexes are 2+ on the right side and 2+ on the left side.      Patellar reflexes are 2+ on the right side and 2+ on the left side.  Achilles reflexes are 2+ on the right side and 2+ on the left side. Psychiatric:        Behavior: Behavior is cooperative.    Wt Readings from Last 3 Encounters:  04/12/21 223 lb (101.2 kg)  02/12/20 206 lb (93.4 kg)  06/17/19 211 lb (95.7 kg)    BP 138/84   Pulse 68   Ht 5\' 11"  (1.803 m)   Wt 223 lb (101.2 kg)   BMI 31.10 kg/m   Assessment and Plan:  ESAW KNIPPEL is a 35 y.o. male who presents today for his Complete Annual Exam. He feels well. He reports exercising . He reports he is sleeping well.  Patient's chart was reviewed for previous encounters most recent labs most recent imaging and care everywhere. 1. Annual physical exam Immunizations are reviewed and recommendations provided.   Age appropriate screening tests are discussed. Counseling given  for risk factor reduction interventions.  No subjective/objective concerns noted during history and physical exam as well as review of system.  Will check lipid panel renal function panel in urinalysis. - Lipid Panel With LDL/HDL Ratio - Testosterone - Renal Function Panel - POCT Occult Blood Stool  2. Drug-induced erectile dysfunction Patient is having some mild erectile dysfunction with some degree of fatigue.  We will check testosterone level as well as other labs of concern.  He has been given a trial of Cialis 5 mg to be used on an as-needed basis - Testosterone - tadalafil (CIALIS) 5 MG tablet; Take 1 tablet (5 mg total) by mouth daily as needed for erectile dysfunction.  Dispense: 10 tablet; Refill: 0 - Renal Function Panel  Immunizations are reviewed and recommendations provided.   Age appropriate screening tests are discussed. Counseling given for risk factor reduction interventions.

## 2021-04-13 LAB — COMPREHENSIVE METABOLIC PANEL
ALT: 53 IU/L — ABNORMAL HIGH (ref 0–44)
AST: 35 IU/L (ref 0–40)
Albumin/Globulin Ratio: 1.6 (ref 1.2–2.2)
Albumin: 4.7 g/dL (ref 4.0–5.0)
Alkaline Phosphatase: 67 IU/L (ref 44–121)
BUN/Creatinine Ratio: 11 (ref 9–20)
BUN: 12 mg/dL (ref 6–20)
Bilirubin Total: 1 mg/dL (ref 0.0–1.2)
CO2: 21 mmol/L (ref 20–29)
Calcium: 9.9 mg/dL (ref 8.7–10.2)
Chloride: 99 mmol/L (ref 96–106)
Creatinine, Ser: 1.13 mg/dL (ref 0.76–1.27)
Globulin, Total: 2.9 g/dL (ref 1.5–4.5)
Glucose: 100 mg/dL — ABNORMAL HIGH (ref 65–99)
Potassium: 4.7 mmol/L (ref 3.5–5.2)
Sodium: 139 mmol/L (ref 134–144)
Total Protein: 7.6 g/dL (ref 6.0–8.5)
eGFR: 88 mL/min/{1.73_m2} (ref >59)

## 2021-04-13 LAB — TESTOSTERONE: Testosterone: 639 ng/dL (ref 264–916)

## 2021-04-13 LAB — RENAL FUNCTION PANEL
Albumin: 5.2 g/dL — ABNORMAL HIGH (ref 4.0–5.0)
BUN/Creatinine Ratio: 10 (ref 9–20)
BUN: 11 mg/dL (ref 6–20)
CO2: 24 mmol/L (ref 20–29)
Calcium: 10.1 mg/dL (ref 8.7–10.2)
Chloride: 99 mmol/L (ref 96–106)
Creatinine, Ser: 1.13 mg/dL (ref 0.76–1.27)
Glucose: 102 mg/dL — ABNORMAL HIGH (ref 65–99)
Phosphorus: 3.4 mg/dL (ref 2.8–4.1)
Potassium: 5.1 mmol/L (ref 3.5–5.2)
Sodium: 137 mmol/L (ref 134–144)
eGFR: 88 mL/min/{1.73_m2} (ref >59)

## 2021-04-13 LAB — LIPID PANEL WITH LDL/HDL RATIO
Cholesterol, Total: 250 mg/dL — ABNORMAL HIGH (ref 100–199)
HDL: 59 mg/dL (ref >39)
LDL Chol Calc (NIH): 168 mg/dL — ABNORMAL HIGH (ref 0–99)
LDL/HDL Ratio: 2.8 ratio (ref 0.0–3.6)
Triglycerides: 130 mg/dL (ref 0–149)
VLDL Cholesterol Cal: 23 mg/dL (ref 5–40)

## 2021-04-20 ENCOUNTER — Telehealth: Payer: Self-pay | Admitting: Psychiatry

## 2021-04-20 ENCOUNTER — Other Ambulatory Visit: Payer: Self-pay | Admitting: Psychiatry

## 2021-04-20 NOTE — Progress Notes (Signed)
Patient reports he is becoming "dependent" on a stimulant and does not want to continue the medication.  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  Stimulants have been discontinued from his medication list.
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
## 2021-04-20 NOTE — Telephone Encounter (Signed)
Patient reports he is becoming "dependent" on a stimulant and does not want to continue the medication.  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  Stimulants have been discontinued from his medication list.  He has 1 or 2 remaining stimulant prescriptions at the pharmacy.  Please call and discontinue those.  It is for methylphenidate.  Please inform the patient there are some new branded nonstimulant meds for ADHD if he is interested he can make an appointment and we can discuss those options.  If he needs other direction from [place] please let us know.

## 2021-04-20 NOTE — Telephone Encounter (Signed)
Pt stated the Remeron is working well for him and he requested a refill.He stated he will be fine until his appt in November.

## 2021-04-21 ENCOUNTER — Other Ambulatory Visit: Payer: Self-pay | Admitting: Psychiatry

## 2021-04-21 MED ORDER — MIRTAZAPINE 15 MG PO TABS: 15.0000 mg | ORAL_TABLET | Freq: Every day | ORAL | 0 refills | Status: DC

## 2021-04-21 NOTE — Telephone Encounter (Signed)
sent 

## 2021-05-20 ENCOUNTER — Other Ambulatory Visit: Payer: Self-pay | Admitting: Psychiatry

## 2021-05-20 DIAGNOSIS — F4001 Agoraphobia with panic disorder: Secondary | ICD-10-CM

## 2021-05-20 DIAGNOSIS — F411 Generalized anxiety disorder: Secondary | ICD-10-CM

## 2021-06-05 ENCOUNTER — Other Ambulatory Visit: Payer: Self-pay

## 2021-06-05 ENCOUNTER — Telehealth: Payer: Self-pay

## 2021-06-05 ENCOUNTER — Telehealth: Payer: Self-pay | Admitting: Family Medicine

## 2021-06-05 DIAGNOSIS — N522 Drug-induced erectile dysfunction: Secondary | ICD-10-CM

## 2021-06-05 DIAGNOSIS — N529 Male erectile dysfunction, unspecified: Secondary | ICD-10-CM

## 2021-06-05 MED ORDER — TADALAFIL 5 MG PO TABS: 5.0000 mg | ORAL_TABLET | Freq: Every day | ORAL | 0 refills | Status: DC | PRN

## 2021-06-05 NOTE — Telephone Encounter (Signed)
Referral Request - Has patient seen PCP for this complaint? yes *If NO, is insurance requiring patient see PCP for this issue before PCP can refer them? Referral for which specialty: urologist Preferred provider/office: N/A Reason for referral: erectile dysfunction

## 2021-06-05 NOTE — Progress Notes (Signed)
Ref to urology placed 

## 2021-06-05 NOTE — Telephone Encounter (Signed)
Copied from CRM [id number]. Topic: General - Inquiry >> Jun 05, 2021 10:37 AM Aretta Nip wrote: Pt needs a script rewritten tadalafil (CIALIS) 5 MG tablet 10 tablet 0 04/12/2021   Sig - Route: Take 1 tablet (5 mg total) by mouth daily as needed for erectile dysfunction. - Oral  Pt states that is was to call Delice Bison back re if this worked well. Pt says it does and is wanting a script for 6 months??? Pt really requesting Delice Bison call back as states details are personal for futher explanation, does not want take as needed, wants to take every day. CB [phone]

## 2021-06-20 ENCOUNTER — Other Ambulatory Visit: Payer: Self-pay

## 2021-06-20 ENCOUNTER — Ambulatory Visit (INDEPENDENT_AMBULATORY_CARE_PROVIDER_SITE_OTHER): Payer: 59 | Admitting: Urology

## 2021-06-20 ENCOUNTER — Encounter: Payer: Self-pay | Admitting: Urology

## 2021-06-20 VITALS — BP 139/83 | HR 58 | Ht 71.0 in | Wt 238.0 lb

## 2021-06-20 DIAGNOSIS — F5232 Male orgasmic disorder: Secondary | ICD-10-CM

## 2021-06-20 MED ORDER — TADALAFIL 5 MG PO TABS: 5.0000 mg | ORAL_TABLET | ORAL | 3 refills | Status: DC

## 2021-06-20 NOTE — Progress Notes (Signed)
   06/20/21 9:10 AM   David Graham [birth year] [id number]  CC: Delayed orgasm/ejaculation  HPI: 35 year old male who has had trouble with delayed orgasm and ejaculation since starting Remeron and Klonopin for anxiety.  He denies any trouble with erections.  He was started on Cialis 5 mg as needed by his PCP which has resolved his delayed ejaculations.  He feels this medication is very helpful, and would like to continue.  He denies any urinary symptoms.  Testosterone was normal at 639 in July 2022.  He previously tried different antianxiety medications but these were not as helpful.   PMH: Past Medical History:  Diagnosis Date   ADHD (attention deficit hyperactivity disorder)    Anxiety     Social History:  reports that he has never smoked. His smokeless tobacco use includes snuff. He reports current alcohol use. He reports that he does not use drugs.  Physical Exam: BP 139/83   Pulse (!) 58   Ht 5\' 11"  (1.803 m)   Wt 238 lb (108 kg)   BMI 33.19 kg/m    Constitutional:  Alert and oriented, No acute distress. Cardiovascular: No clubbing, cyanosis, or edema. Respiratory: Normal respiratory effort, no increased work of breathing. GI: Abdomen is soft, nontender, nondistended, no abdominal masses  Assessment & Plan:   35 year old male with anxiety and side effects of medications of delayed orgasm/ejaculation.  This is resolved with low-dose Cialis every other day.  I recommended continuing this medication, and we discussed titration options including 2.5 mg every other day.    Cialis 2.5 to 5 mg every other day He would like to continue urology follow-up yearly   20, MD 06/20/2021  [facility] [address] [place], [place] [phone]

## 2021-08-03 ENCOUNTER — Other Ambulatory Visit: Payer: Self-pay | Admitting: Psychiatry

## 2021-08-03 NOTE — Telephone Encounter (Signed)
90 day ok?appt on 08/14/21

## 2021-08-14 ENCOUNTER — Ambulatory Visit (INDEPENDENT_AMBULATORY_CARE_PROVIDER_SITE_OTHER): Payer: 59 | Admitting: Psychiatry

## 2021-08-14 ENCOUNTER — Other Ambulatory Visit: Payer: Self-pay

## 2021-08-14 ENCOUNTER — Encounter: Payer: Self-pay | Admitting: Psychiatry

## 2021-08-14 DIAGNOSIS — F39 Unspecified mood [affective] disorder: Secondary | ICD-10-CM

## 2021-08-14 DIAGNOSIS — F902 Attention-deficit hyperactivity disorder, combined type: Secondary | ICD-10-CM

## 2021-08-14 DIAGNOSIS — F4001 Agoraphobia with panic disorder: Secondary | ICD-10-CM | POA: Diagnosis not present

## 2021-08-14 DIAGNOSIS — F411 Generalized anxiety disorder: Secondary | ICD-10-CM

## 2021-08-14 MED ORDER — CLONAZEPAM 0.5 MG PO TABS: 0.5000 mg | ORAL_TABLET | Freq: Three times a day (TID) | ORAL | 5 refills | Status: AC | PRN

## 2021-08-14 MED ORDER — NEFAZODONE HCL 50 MG PO TABS: 50.0000 mg | ORAL_TABLET | Freq: Two times a day (BID) | ORAL | 1 refills | Status: DC

## 2021-08-14 NOTE — Progress Notes (Signed)
[name] [id number] [birth year] 35 y.o.     Subjective:   Patient ID:  David Graham is a 35 y.o. (DOB [birth year]) male.  Chief Complaint:  Chief Complaint  Patient presents with   ADHD   Anxiety   Panic disorder with agoraphobia    Anxiety Symptoms include decreased concentration and nervous/anxious behavior. Patient reports no confusion, palpitations or suicidal ideas.    David Graham  Follow up for intolerable anxiety.  AT visit 01/07/2019 and was to start sertraline solution.  Increased sertaline to 1.5 ml of Zoloft solution from 38ml.    His visit February 17, 2019.  His anxiety was significantly improved with the sertraline suspension at 30 mg daily.  Called 03/04/19 wanting to try buspirone and 10 mg BID sent in.  05/2019 visit with following noted: He felt it elevated anxiety and stopped it. Still on sertraline 30 mg suspension.  Klonopin 0.5 mg up to 3 daily but tries to limit it.  Average 2 daily.  He feels he can't go later.   Example meetings trigger anxiety. He doesn't see SE.  Taking more logical steps to get his life under better control and going pretty well.  Doesn't want to change meds. Anxiety has been alright and about the same as when last seen.   Feels he had less anxiety when he was taking Adderall.  He felt more confident.  Was more effective at work.  Was tested for ADD in the past and had a positive. 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  My mind was sharper than it is now. Asking for alternative ADD medication Not depressed.   No memory complaints.  Appetite is good.  Exercising.  Sleeping well.  Not having panic attacks now.  Has normal levels of anxiety and manages his anxiety much better. Plan: Start modafinil 200 mg tablet one half every morning for 7 days then 1 every morning if needed.  Do not exceed this recommended dose. Continue sertraline 30 mg daily Continue clonazepam but try to limit the dosage for cognitive reasons.   06/19/2019  phone call from patient stating that the modafinil was not working and he wanted to try a stimulant like Adderall.  This was refused because he has a history of Adderall abuse.  The other option mentioned was Strattera.  He refused Strattera and stated he wanted to change doctors.  08/31/2020 appointment with the following noted: Not necessarily looking for med changes. As can be seen the patient has been instructed to follow-up 2 to 3 months after the September 2020 appointment but did not do so. PDMP indicates patient is getting methyl fendedate 20 mg tablets number 60/month and clonazepam 0.5 mg tablets number 90/month from another provider.  In September he had an Adderall prescription for 20 mg tablets number 60/month. Last filled Ritalin and lonazepam 0.5 #90 in 07/13/20. Decided to come back bc of cost reasons.  Had been [place] in [place]. Taking clonazepam 0.5 mg TID, Ritalin 20 BID, Zoloft and they were charging for drugtests  Thinks Ritalin wears off in about 2 daily. Typically taking clonazepam 0.5 mg 2-3 daily.  If forgets bc would get withdrawal. Typically taking 40 mg Ritalin daily.  Denies abuse.  Nothing like taking Adderall. Zoloft 12.5 builds up in his system and then hazes his thinking.  So he can't take it every day Work same job for 7 mos and things going OK.  Life is alright. Manageable rare panic.  Not  depressed.   Was having less anxiety until started working with coworker who is stressful.  Anxiety is not debilitating.   Provided documents from age 70 showing history of ADD and feels he needs Ritalin to pass the tests. Works for [employer] and subject to random drug tests.  No drug abuse.   Was getting drug tests with last provider. Plan: Continue sertraline 12.5 mg daily Continue clonazepam but try to limit the dosage for cognitive reasons.   11/03/2020 phone call from patient: Patient called with concern about his Zoloft medication. He states that he  experiencing a delayed orgasm, and it is becoming a problem in sexual activities. He also says that he read online that the medication Buspar when combined with the Zoloft will stop this problem. He would like to know if you would recommend him trying the Buspar. MD response: CC   5:49 PM Note Please let him know that I agreed to the trial of the buspirone to reduce sexual side effects from sertraline.  He can try half a tablet twice daily.  It should work within a week or 2.  If it does not help then he can increase to 1 tablet twice daily.  Buspirone also has its on antianxiety effects which may be helpful as well for his overall anxiety level.     11/29/2020 appointment with the following noted: Pretty good with anxierty and depression. Disc BP issues but took preworkout supplements and Ritalin and just came from the gym.  Did feel a little more benefit of sertraline by being more consistent.  Admits historically not consistent with sertraline but is now.  Now has less mood swings and irrtiablity by being more consistent.  1-2 mg increase is notable but causes  More sexual SE.   With buspirone didn't help the delayed orgasm.  Increased buspirone to 10 mg BID. No change in anxiety either.   Zoloft doesn't reduce libido but only orgasm.   Typically taking clonazepam TID for sleep and anxiety.  Plan no changes  03/15/21 appt noted: Taking sertraline about 10-15 mg he thinks or about 1/2 ml. Pretty good overall but working a lot. Still takes Ritalin 20 BID usually which is pretty mild.  Takes it daily. Takes clonazepam TID still.   Takes buspirone prn. Sexual Se sertraline but not anything else. Sleep is good.   [place] stopped drinking 3 weeks ago and started to feel better.  Was drinking regularly before. BP went up and now back to normal. No sig avoidance. No DUI but family was concerned about his drinking.  08/14/2021 appointment with the following noted: Start Ritalin because he  felt that he was becoming dependent on it. Continues clonazepam 0.5 mg 3 times daily as needed anxiety Continues mirtazapine 15 mg nightly Stopped sertraline when started the mirtazapine DT sexual SE. When up on mirtazapine to 22.5 mg to help with anxiety more. Not depressed. Wants to focus on anxiety.  Heard that Anne Ng is available.   Past psych med intolerances : Fluoxetine, Lexapro caused GI side effects, sertraline most ever 50mg ,  Viibryd allergic, duloxetine SE sexual, Pristiq, mirtazapine Trileptal caused a rash, Depakote. Lamotrigine Vraylar worst one ever,  Gabapentin made him feel weird.   Hx Adderall abuse.  Ritalin Modafinil NR Wellbutrin worst of all.   Xanax and Ativan poor response.  Review of Systems:  Review of Systems  Cardiovascular:  Negative for palpitations.  Neurological:  Negative for tremors, weakness and headaches.  Psychiatric/Behavioral:  Positive for  decreased concentration. Negative for agitation, behavioral problems, confusion, dysphoric mood, hallucinations, self-injury, sleep disturbance and suicidal ideas. The patient is nervous/anxious. The patient is not hyperactive.    Medications: I have reviewed the patient's current medications.  Current Outpatient Medications  Medication Sig Dispense Refill   mirtazapine (REMERON) 15 MG tablet TAKE 1 TABLET BY MOUTH EVERYDAY AT BEDTIME 90 tablet 0   nefazodone (SERZONE) 50 MG tablet Take 1 tablet (50 mg total) by mouth 2 (two) times daily. 60 tablet 1   tadalafil (CIALIS) 5 MG tablet Take 1 tablet (5 mg total) by mouth daily as needed for erectile dysfunction. 10 tablet 0   clonazePAM (KLONOPIN) 0.5 MG tablet Take 1 tablet (0.5 mg total) by mouth 3 (three) times daily as needed for anxiety. 90 tablet 5   No current facility-administered medications for this visit.    Medication Side Effects: mild delayed ejaculation. Not a big problem.  Night sweats resolved. Tends to have a lot of SE from meds.  Med  sensitive.  Allergies:  Allergies  Allergen Reactions   Oxcarbazepine    Viibryd [Vilazodone Hcl]     Past Medical History:  Diagnosis Date   ADHD (attention deficit hyperactivity disorder)    Anxiety     History reviewed. No pertinent family history.  Social History   Socioeconomic History   Marital status: Single    Spouse name: Not on file   Number of children: Not on file   Years of education: Not on file   Highest education level: Not on file  Occupational History   Not on file  Tobacco Use   Smoking status: Never   Smokeless tobacco: Current    Types: Snuff   Tobacco comments:    occassional  Substance and Sexual Activity   Alcohol use: Yes    Alcohol/week: 0.0 standard drinks   Drug use: No   Sexual activity: Not Currently  Other Topics Concern   Not on file  Social History Narrative   Not on file   Social Determinants of Health   Financial Resource Strain: Not on file  Food Insecurity: Not on file  Transportation Needs: Not on file  Physical Activity: Not on file  Stress: Not on file  Social Connections: Not on file  Intimate Partner Violence: Not on file    Past Medical History, Surgical history, Social history, and Family history were reviewed and updated as appropriate.   Please see review of systems for further details on the patient's review from today.   Objective:   Physical Exam:  There were no vitals taken for this visit.  Physical Exam Constitutional:      General: He is not in acute distress.    Appearance: He is well-developed.  Musculoskeletal:        General: No deformity.  Neurological:     Mental Status: He is alert and oriented to person, place, and time.     Coordination: Coordination normal.  Psychiatric:        Attention and Perception: He is attentive.        Mood and Affect: Mood is anxious. Mood is not depressed. Affect is not labile, blunt, angry or inappropriate.        Speech: Speech normal.        Behavior:  Behavior normal.        Thought Content: Thought content normal. Thought content is not paranoid. Thought content does not include homicidal or suicidal ideation.  Cognition and Memory: Cognition normal.        Judgment: Judgment normal.     Comments: Insight is improved and judgment better about use of meds and compliance.     Lab Review:     Component Value Date/Time   NA 137 04/12/2021 1058   NA 136 09/25/2013 1151   K 5.1 04/12/2021 1058   K 4.1 09/25/2013 1151   CL 99 04/12/2021 1058   CL 103 09/25/2013 1151   CO2 24 04/12/2021 1058   CO2 31 09/25/2013 1151   GLUCOSE 102 (H) 04/12/2021 1058   GLUCOSE 75 09/25/2013 1151   BUN 11 04/12/2021 1058   BUN 13 09/25/2013 1151   CREATININE 1.13 04/12/2021 1058   CREATININE 1.10 09/25/2013 1151   CALCIUM 10.1 04/12/2021 1058   CALCIUM 9.7 09/25/2013 1151   PROT 7.6 04/12/2021 1055   PROT 8.4 (H) 09/25/2013 1151   ALBUMIN 5.2 (H) 04/12/2021 1058   ALBUMIN 4.4 09/25/2013 1151   AST 35 04/12/2021 1055   AST 37 09/25/2013 1151   ALT 53 (H) 04/12/2021 1055   ALT 32 09/25/2013 1151   ALKPHOS 67 04/12/2021 1055   ALKPHOS 81 09/25/2013 1151   BILITOT 1.0 04/12/2021 1055   BILITOT 0.6 09/25/2013 1151   GFRNONAA 93 04/02/2019 1040   GFRNONAA >60 09/25/2013 1151   GFRAA 108 04/02/2019 1040   GFRAA >60 09/25/2013 1151       Component Value Date/Time   WBC 3.4 04/02/2019 1040   WBC 6.0 09/25/2013 1151   RBC 4.70 04/02/2019 1040   RBC 5.34 09/25/2013 1151   HGB 15.2 04/02/2019 1040   HCT 43.2 04/02/2019 1040   PLT 136 (L) 04/02/2019 1040   MCV 92 04/02/2019 1040   MCV 89 09/25/2013 1151   MCH 32.3 04/02/2019 1040   MCH 32.0 09/25/2013 1151   MCHC 35.2 04/02/2019 1040   MCHC 35.8 09/25/2013 1151   RDW 12.9 04/02/2019 1040   RDW 12.7 09/25/2013 1151   LYMPHSABS 1.1 04/02/2019 1040   LYMPHSABS 1.5 09/25/2013 1151   MONOABS 0.7 09/25/2013 1151   EOSABS 0.2 04/02/2019 1040   EOSABS 0.3 09/25/2013 1151   BASOSABS 0.0  04/02/2019 1040   BASOSABS 0.1 09/25/2013 1151    No results found for: POCLITH, LITHIUM   No results found for: PHENYTOIN, PHENOBARB, VALPROATE, CBMZ   .res Assessment: Plan:    Panic disorder with agoraphobia - Plan: nefazodone (SERZONE) 50 MG tablet, clonazePAM (KLONOPIN) 0.5 MG tablet  Generalized anxiety disorder - Plan: nefazodone (SERZONE) 50 MG tablet  Episodic mood disorder (HCC)  Attention deficit hyperactivity disorder (ADHD), combined type  Hx Adderall Abuse in remission Rule out OCD  Greater than 50% of 30-minute face to face time with patient was spent on counseling and coordination of care. We discussed the following: Overall Andrews anxiety was improved with ultra low dose sertraline.  But he became frustrated with sexual SE and feeling a little numbed generally.  Doesn't feel SE with mirtazapine but less benefit with anxiety.  Discussed the dosing around mirtazapine for anxiety.  Discussed the alternatives of tricyclic's or MAO inhibitors for anxiety.  He mentions that he had read that nefazodone was now available again.  There was an attempt to research that briefly in the office while he was present but the physician was unable to verify this.  However we discussed side effects of nefazodone in detail and I did give him a prescription to start at the lowest dose 50 mg  nightly and go go up to 100 mg nightly if he could find it.  It does have a good antianxiety effect.  We discussed the rare risk of hepatitis from it.  We discussed other potential side effects as well such as GI side effects and sedation.  PDMP shows patient picking up clonazepam but stopped methylphenidate monthly but no controlled substances from other providers.  Continue clonazepam 0.5 mg 3 times daily as needed.  Any generic except Actavis which works less well.  As the anxiety improves gradually reduce the dosage of clonazepam.  Encouraged is low the use of this is absolutely possible given his  concerns about attention and focus.  Discussed how this can interfere with attention and focus even if he were to take a stimulant.  We discussed the short-term risks associated with benzodiazepines including sedation and increased fall risk among others.  Discussed long-term side effect risk including tolerance, dependence, potential withdrawal symptoms, and the potential eventual dose-related risk of dementia.  Disc risk with alcohol and do not combine them.  He has stopped stimulants and agreed that he needs to avoid future stimulant prescriptions.  We discussed the potential of using a nonstimulant such as Strattera or more likely Qellbree in the future when his anxiety is better managed.  History of caffeine excess.   Follow-up in a couple of months if he can find the nefazodone to start.  Meredith Staggers, MD, DFAPA   Future Appointments  Date Time Provider Department Center  06/26/2022  8:30 AM Sondra Come, MD [facility] None    No orders of the defined types were placed in this encounter.     -------------------------------

## 2021-08-15 ENCOUNTER — Ambulatory Visit: Payer: 59 | Admitting: Psychiatry

## 2021-08-15 ENCOUNTER — Telehealth: Payer: Self-pay | Admitting: Psychiatry

## 2021-08-15 ENCOUNTER — Other Ambulatory Visit: Payer: Self-pay | Admitting: Psychiatry

## 2021-08-15 MED ORDER — TRAZODONE HCL 50 MG PO TABS: 50.0000 mg | ORAL_TABLET | Freq: Every day | ORAL | 1 refills | Status: DC

## 2021-08-15 NOTE — Telephone Encounter (Signed)
He called back to add that it doesn't have to be Trazadone, if Dr. Jennelle Human thinks there is something else.  He just said he doesn't want anything that might effect him sexually.

## 2021-08-15 NOTE — Telephone Encounter (Signed)
OK, trial trazodone.  That is reasonable for anxiety. Trazodone 50 mg tab 1/2 at night for 1 week, then 1 at night.  Sent RX

## 2021-08-15 NOTE — Telephone Encounter (Signed)
Davien called to report that the Nefazodone is NOT on the market so he was unable to get it.  He asked if he could try trazodone.  Please call to discuss

## 2021-08-15 NOTE — Telephone Encounter (Signed)
Please review

## 2021-08-16 NOTE — Telephone Encounter (Signed)
Pt called back and stated he took the trazodone and it made him sick.He wants to know is there a med to pair with Remeron.He mentioned liquid zoloft and stated "he just needs something to help".

## 2021-08-16 NOTE — Telephone Encounter (Signed)
Pt informed

## 2021-08-18 ENCOUNTER — Other Ambulatory Visit: Payer: Self-pay | Admitting: Psychiatry

## 2021-08-18 MED ORDER — PAROXETINE HCL 10 MG PO TABS: ORAL_TABLET | ORAL | 1 refills | Status: DC

## 2021-08-18 NOTE — Telephone Encounter (Signed)
I would be happy to prescribe again low dose liquid sertraline (Zoloft) for anxiety.  But he may want to try low dose Paxil instead.  It is even better for anxiety but does have the same risk of sexual SE but everybody is different and he may have less with this.  Let me know which he prefers and I'll send in a low dose

## 2021-08-18 NOTE — Telephone Encounter (Signed)
He would like to try Paxil [id number], [place] - [address] [place] [address]

## 2021-09-06 ENCOUNTER — Other Ambulatory Visit: Payer: Self-pay | Admitting: Psychiatry

## 2021-09-10 ENCOUNTER — Other Ambulatory Visit: Payer: Self-pay | Admitting: Psychiatry

## 2021-09-11 NOTE — Telephone Encounter (Signed)
Call to RS

## 2021-09-14 NOTE — Telephone Encounter (Signed)
Called and LVM

## 2021-10-23 ENCOUNTER — Other Ambulatory Visit: Payer: Self-pay

## 2021-10-23 ENCOUNTER — Telehealth: Payer: Self-pay | Admitting: Psychiatry

## 2021-10-23 MED ORDER — CLONIDINE HCL 0.1 MG PO TABS: 0.1000 mg | ORAL_TABLET | Freq: Two times a day (BID) | ORAL | 0 refills | Status: DC

## 2021-10-23 NOTE — Telephone Encounter (Signed)
The next best option is to add clonidine 0.1 mg tablets 1/2-1 twice daily.  We can send that prescription in for 60 tablets and no refills.  He cannot stop clonazepam abruptly.  However if the clonidine helps him he can gradually reduce the clonazepam.  We can discuss this more at his upcoming appointment.

## 2021-10-23 NOTE — Telephone Encounter (Signed)
David Graham called to report that the Klonopin isn't working anymore.  He feels perhaps he has built up a tolerance for it.  When he takes it, he feels like has taken a sugar pill.  Still having anxiety.  Is there another medication he could take?  Or what would you recommend.  Appt 3/7

## 2021-10-23 NOTE — Telephone Encounter (Signed)
Pt stated he takes it 3 times daily.It just does not seem to help anxiety at all anymore.He wants to know if the dose can be increased or if he can try a different long acting anxiety med.He was on ativan in the past and it did not help much.

## 2021-10-23 NOTE — Telephone Encounter (Signed)
Pt informed

## 2021-10-25 NOTE — Telephone Encounter (Signed)
Pt called today reporting he started Clonidine 0.1 mg  2/d. First day took 1 tab of the 0.1 mg 2/d. He noticed when working out @ the gym  started having heart palpitations. Feeling out of it. Can't think clearly. He then cut back to 1/2 tab 2/d. Today he has cut back to 1/4 tab. Apt 3/7. Advise if Pt needs to continue med. Contact # [phone]  Pt on canc list. Starts new job 2/16. If possible need apt before new job.

## 2021-10-25 NOTE — Telephone Encounter (Signed)
Please review

## 2021-10-26 ENCOUNTER — Ambulatory Visit (INDEPENDENT_AMBULATORY_CARE_PROVIDER_SITE_OTHER): Payer: 59 | Admitting: Psychiatry

## 2021-10-26 ENCOUNTER — Encounter: Payer: Self-pay | Admitting: Psychiatry

## 2021-10-26 ENCOUNTER — Other Ambulatory Visit: Payer: Self-pay

## 2021-10-26 DIAGNOSIS — F902 Attention-deficit hyperactivity disorder, combined type: Secondary | ICD-10-CM | POA: Diagnosis not present

## 2021-10-26 DIAGNOSIS — F4001 Agoraphobia with panic disorder: Secondary | ICD-10-CM

## 2021-10-26 DIAGNOSIS — F411 Generalized anxiety disorder: Secondary | ICD-10-CM

## 2021-10-26 DIAGNOSIS — F39 Unspecified mood [affective] disorder: Secondary | ICD-10-CM | POA: Diagnosis not present

## 2021-10-26 MED ORDER — DIAZEPAM 5 MG PO TABS: 5.0000 mg | ORAL_TABLET | Freq: Three times a day (TID) | ORAL | 0 refills | Status: DC

## 2021-10-26 MED ORDER — IMIPRAMINE HCL 10 MG PO TABS: ORAL_TABLET | ORAL | 1 refills | Status: DC

## 2021-10-26 NOTE — Progress Notes (Signed)
David Graham [id number] 1985-09-22 36 y.o.     Subjective:   Patient ID:  David Graham is a 36 y.o. (DOB 1985-09-22) male.  Chief Complaint:  Chief Complaint  Patient presents with   Follow-up    Panic disorder with agoraphobia   ADHD   Anxiety    Anxiety Symptoms include decreased concentration and nervous/anxious behavior. Patient reports no chest pain, confusion, palpitations or suicidal ideas.    David Graham  Follow up for intolerable anxiety.  AT visit 01/07/2019 and was to start sertraline solution.  Increased sertaline to 1.5 ml of Zoloft solution from 1ml.    His visit February 17, 2019.  His anxiety was significantly improved with the sertraline suspension at 30 mg daily.  Called 03/04/19 wanting to try buspirone and 10 mg BID sent in.  05/2019 visit with following noted: He felt it elevated anxiety and stopped it. Still on sertraline 30 mg suspension.  Klonopin 0.5 mg up to 3 daily but tries to limit it.  Average 2 daily.  He feels he can't go later.   Example meetings trigger anxiety. He doesn't see SE.  Taking more logical steps to get his life under better control and going pretty well.  Doesn't want to change meds. Anxiety has been alright and about the same as when last seen.   Feels he had less anxiety when he was taking Adderall.  He felt more confident.  Was more effective at work.  Was tested for ADD in the past and had a positive. 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  My mind was sharper than it is now. Asking for alternative ADD medication Not depressed.   No memory complaints.  Appetite is good.  Exercising.  Sleeping well.  Not having panic attacks now.  Has normal levels of anxiety and manages his anxiety much better. Plan: Start modafinil 200 mg tablet one half every morning for 7 days then 1 every morning if needed.  Do not exceed this recommended dose. Continue sertraline 30 mg daily Continue clonazepam but try to limit the dosage for cognitive  reasons.   06/19/2019 phone call from patient stating that the modafinil was not working and he wanted to try a stimulant like Adderall.  This was refused because he has a history of Adderall abuse.  The other option mentioned was Strattera.  He refused Strattera and stated he wanted to change doctors.  08/31/2020 appointment with the following noted: Not necessarily looking for med changes. As can be seen the patient has been instructed to follow-up 2 to 3 months after the September 2020 appointment but did not do so. PDMP indicates patient is getting methyl fendedate 20 mg tablets number 60/month and clonazepam 0.5 mg tablets number 90/month from another provider.  In September he had an Adderall prescription for 20 mg tablets number 60/month. Last filled Ritalin and lonazepam 0.5 #90 in 07/13/20. Decided to come back bc of cost reasons.  [place] [facility] [place]. Taking clonazepam 0.5 mg TID, Ritalin 20 BID, Zoloft and they were charging for drugtests  Thinks Ritalin wears off in about 2 daily. Typically taking clonazepam 0.5 mg 2-3 daily.  If forgets bc would get withdrawal. Typically taking 40 mg Ritalin daily.  Denies abuse.  Nothing like taking Adderall. Zoloft 12.5 builds up in his system and then hazes his thinking.  So he can't take it every day Work same job for 7 mos and things going OK.  Life is  alright. Manageable rare panic.  Not depressed.   Was having less anxiety until started working with coworker who is stressful.  Anxiety is not debilitating.   Provided documents from age 31 showing history of ADD and feels he needs Ritalin to pass the tests. Works for [employer] and subject to random drug tests.  No drug abuse.   Was getting drug tests with last provider. Plan: Continue sertraline 12.5 mg daily Continue clonazepam but try to limit the dosage for cognitive reasons.   11/03/2020 phone call from patient: Patient called with concern about his Zoloft medication. He  states that he experiencing a delayed orgasm, and it is becoming a problem in sexual activities. He also says that he read online that the medication Buspar when combined with the Zoloft will stop this problem. He would like to know if you would recommend him trying the Buspar. MD response: CC   5:49 PM Note Please let him know that I agreed to the trial of the buspirone to reduce sexual side effects from sertraline.  He can try half a tablet twice daily.  It should work within a week or 2.  If it does not help then he can increase to 1 tablet twice daily.  Buspirone also has its on antianxiety effects which may be helpful as well for his overall anxiety level.     11/29/2020 appointment with the following noted: Pretty good with anxierty and depression. Disc BP issues but took preworkout supplements and Ritalin and just came from the gym.  Did feel a little more benefit of sertraline by being more consistent.  Admits historically not consistent with sertraline but is now.  Now has less mood swings and irrtiablity by being more consistent.  1-2 mg increase is notable but causes  More sexual SE.   With buspirone didn't help the delayed orgasm.  Increased buspirone to 10 mg BID. No change in anxiety either.   Zoloft doesn't reduce libido but only orgasm.   Typically taking clonazepam TID for sleep and anxiety.  Plan no changes  03/15/21 appt noted: Taking sertraline about 10-15 mg he thinks or about 1/2 ml. Pretty good overall but working a lot. Still takes Ritalin 20 BID usually which is pretty mild.  Takes it daily. Takes clonazepam TID still.   Takes buspirone prn. Sexual Se sertraline but not anything else. Sleep is good.   [place] stopped drinking 3 weeks ago and started to feel better.  Was drinking regularly before. BP went up and now back to normal. No sig avoidance. No DUI but family was concerned about his drinking.  08/14/2021 appointment with the following noted: Start  Ritalin because he felt that he was becoming dependent on it. Continues clonazepam 0.5 mg 3 times daily as needed anxiety Continues mirtazapine 15 mg nightly Stopped sertraline when started the mirtazapine DT sexual SE. When up on mirtazapine to 22.5 mg to help with anxiety more. Not depressed. Wants to focus on anxiety.  Heard that Anne Ng is available.  08/14/2021 phone call saying nefazodone was not available and he wanted to try trazodone for anxiety.  We started 25 mg daily and increased to 50 mg.  He called back the next day stating that the trazodone made him sick. He wanted to try something else for anxiety and paroxetine was sent.  5 then 10 mg nightly.  10/23/2021 phone call complaining that Klonopin is not helping anymore and he developed tolerance. Suggested a trial of clonidine 0.1 mg tablets  1/2-1 twice daily off label for anxiety. He called back the next day stating that it seemed to cause heart palpitations when he was at the gym and he was not able to think is clearly.  Was told that he could try the half tablet dosage until his appointment.  10/26/2021 appointment with the following noted: Since being here he has tried trazodone, paroxetine and clonidine and is not taking any of those. He is taking clonazepam 0.5 mg 3 times daily. No relief with anxiety and drinking too much.  When drinks can drink 12 pack per day and then tries to stop. Promotion at work and new GF and should be OK but miserable. Working to find a Paramedic. Now developed fear of blushing and realizes it is weird. Also still feels need to deal with ADD.   No pot and didn't like it.   Only think that took away anxiety was stimulants.  But admits he overtook them.    Past psych med intolerances : Fluoxetine, Lexapro caused GI side effects, sertraline most ever 50mg ,  duloxetine SE sexual, Pristiq,  Viibryd allergic,  Paroxetine SE  Clonidine SE PALPITATIONS Mirtazapine Trazodone nausea Trileptal caused a  rash, Depakote. Lamotrigine Vraylar worst one ever,  Gabapentin made him feel weird.   Hx Adderall abuse.  Ritalin Modafinil NR Wellbutrin worst of all.   Xanax and Ativan poor response.  Review of Systems:  Review of Systems  Cardiovascular:  Negative for chest pain and palpitations.  Neurological:  Negative for tremors, weakness and headaches.  Psychiatric/Behavioral:  Positive for decreased concentration. Negative for agitation, behavioral problems, confusion, dysphoric mood, hallucinations, self-injury, sleep disturbance and suicidal ideas. The patient is nervous/anxious. The patient is not hyperactive.    Medications: I have reviewed the patient's current medications.  Current Outpatient Medications  Medication Sig Dispense Refill   clonazePAM (KLONOPIN) 0.5 MG tablet Take 1 tablet (0.5 mg total) by mouth 3 (three) times daily as needed for anxiety. 90 tablet 5   cloNIDine (CATAPRES) 0.1 MG tablet Take 1 tablet (0.1 mg total) by mouth 2 (two) times daily. (Patient not taking: Reported on 10/26/2021) 60 tablet 0   diazepam (VALIUM) 5 MG tablet Take 1 tablet (5 mg total) by mouth in the morning, at noon, and at bedtime. 90 tablet 0   sertraline (ZOLOFT) 20 MG/ML concentrated solution Take by mouth daily. 20 mg     tadalafil (CIALIS) 5 MG tablet Take 1 tablet (5 mg total) by mouth daily as needed for erectile dysfunction. 10 tablet 0   hydrOXYzine (VISTARIL) 25 MG capsule Take 1 capsule (25 mg total) by mouth 3 (three) times daily as needed for anxiety or itching. 90 capsule 0   PARoxetine (PAXIL) 10 MG tablet 1/2 tablet at night for 2 weeks then 1 tablet each night (Patient not taking: Reported on 10/26/2021) 30 tablet 1   traZODone (DESYREL) 50 MG tablet TAKE 1 TABLET BY MOUTH EVERYDAY AT BEDTIME (Patient not taking: Reported on 10/26/2021) 90 tablet 0   No current facility-administered medications for this visit.    Medication Side Effects: mild delayed ejaculation. Not a big problem.   Night sweats resolved. Tends to have a lot of SE from meds.  Med sensitive.  Allergies:  Allergies  Allergen Reactions   Imipramine Hives   Oxcarbazepine    Viibryd [Vilazodone Hcl]     Past Medical History:  Diagnosis Date   ADHD (attention deficit hyperactivity disorder)    Anxiety     History reviewed.  No pertinent family history.  Social History   Socioeconomic History   Marital status: Single    Spouse name: Not on file   Number of children: Not on file   Years of education: Not on file   Highest education level: Not on file  Occupational History   Not on file  Tobacco Use   Smoking status: Never   Smokeless tobacco: Current    Types: Snuff   Tobacco comments:    occassional  Substance and Sexual Activity   Alcohol use: Yes    Alcohol/week: 0.0 standard drinks   Drug use: No   Sexual activity: Not Currently  Other Topics Concern   Not on file  Social History Narrative   Not on file   Social Determinants of Health   Financial Resource Strain: Not on file  Food Insecurity: Not on file  Transportation Needs: Not on file  Physical Activity: Not on file  Stress: Not on file  Social Connections: Not on file  Intimate Partner Violence: Not on file    Past Medical History, Surgical history, Social history, and Family history were reviewed and updated as appropriate.   Please see review of systems for further details on the patient's review from today.   Objective:   Physical Exam:  There were no vitals taken for this visit.  Physical Exam Constitutional:      General: He is not in acute distress.    Appearance: He is well-developed.  Musculoskeletal:        General: No deformity.  Neurological:     Mental Status: He is alert and oriented to person, place, and time.     Coordination: Coordination normal.  Psychiatric:        Attention and Perception: He is attentive.        Mood and Affect: Mood is anxious. Mood is not depressed. Affect is not  labile, blunt, angry or inappropriate.        Speech: Speech normal.        Behavior: Behavior normal.        Thought Content: Thought content normal. Thought content is not paranoid. Thought content does not include homicidal or suicidal ideation.        Cognition and Memory: Cognition normal.        Judgment: Judgment normal.     Comments: Insight is improved and judgment better about use of meds and compliance. Chronic anxiety     Lab Review:     Component Value Date/Time   NA 137 04/12/2021 1058   NA 136 09/25/2013 1151   K 5.1 04/12/2021 1058   K 4.1 09/25/2013 1151   CL 99 04/12/2021 1058   CL 103 09/25/2013 1151   CO2 24 04/12/2021 1058   CO2 31 09/25/2013 1151   GLUCOSE 102 (H) 04/12/2021 1058   GLUCOSE 75 09/25/2013 1151   BUN 11 04/12/2021 1058   BUN 13 09/25/2013 1151   CREATININE 1.13 04/12/2021 1058   CREATININE 1.10 09/25/2013 1151   CALCIUM 10.1 04/12/2021 1058   CALCIUM 9.7 09/25/2013 1151   PROT 7.6 04/12/2021 1055   PROT 8.4 (H) 09/25/2013 1151   ALBUMIN 5.2 (H) 04/12/2021 1058   ALBUMIN 4.4 09/25/2013 1151   AST 35 04/12/2021 1055   AST 37 09/25/2013 1151   ALT 53 (H) 04/12/2021 1055   ALT 32 09/25/2013 1151   ALKPHOS 67 04/12/2021 1055   ALKPHOS 81 09/25/2013 1151   BILITOT 1.0 04/12/2021 1055   BILITOT 0.6  09/25/2013 1151   GFRNONAA 93 04/02/2019 1040   GFRNONAA >60 09/25/2013 1151   GFRAA 108 04/02/2019 1040   GFRAA >60 09/25/2013 1151       Component Value Date/Time   WBC 3.4 04/02/2019 1040   WBC 6.0 09/25/2013 1151   RBC 4.70 04/02/2019 1040   RBC 5.34 09/25/2013 1151   HGB 15.2 04/02/2019 1040   HCT 43.2 04/02/2019 1040   PLT 136 (L) 04/02/2019 1040   MCV 92 04/02/2019 1040   MCV 89 09/25/2013 1151   MCH 32.3 04/02/2019 1040   MCH 32.0 09/25/2013 1151   MCHC 35.2 04/02/2019 1040   MCHC 35.8 09/25/2013 1151   RDW 12.9 04/02/2019 1040   RDW 12.7 09/25/2013 1151   LYMPHSABS 1.1 04/02/2019 1040   LYMPHSABS 1.5 09/25/2013 1151    MONOABS 0.7 09/25/2013 1151   EOSABS 0.2 04/02/2019 1040   EOSABS 0.3 09/25/2013 1151   BASOSABS 0.0 04/02/2019 1040   BASOSABS 0.1 09/25/2013 1151    No results found for: POCLITH, LITHIUM   No results found for: PHENYTOIN, PHENOBARB, VALPROATE, CBMZ   .res Assessment: Plan:    Generalized anxiety disorder - Plan: diazepam (VALIUM) 5 MG tablet, Imipramine with  desipramine, blood, DISCONTINUED: imipramine (TOFRANIL) 10 MG tablet  Panic disorder with agoraphobia - Plan: diazepam (VALIUM) 5 MG tablet, Imipramine with  desipramine, blood, DISCONTINUED: imipramine (TOFRANIL) 10 MG tablet  Episodic mood disorder (HCC)  Attention deficit hyperactivity disorder (ADHD), combined type - Plan: DISCONTINUED: imipramine (TOFRANIL) 10 MG tablet  Hx Adderall Abuse in remission Rule out OCD  Greater than 50% of 30-minute face to face time with patient was spent on counseling and coordination of care. We discussed the following: Overall Andrews anxiety was improved with ultra low dose sertraline.  But he became frustrated with sexual SE and feeling a little numbed generally.  DC mirtazapine DT NR   Trial imipramine for TR anxiety  PDMP shows patient picking up clonazepam but stopped methylphenidate monthly but no controlled substances from other providers.  Continue clonazepam 0.5 mg 3 times daily as needed.  Any generic except Actavis which works less well.  As the anxiety improves gradually reduce the dosage of clonazepam.  Encouraged is low the use of this is absolutely possible given his concerns about attention and focus.  Discussed how this can interfere with attention and focus even if he were to take a stimulant. OK change clonazepam to diazepam  We discussed the short-term risks associated with benzodiazepines including sedation and increased fall risk among others.  Discussed long-term side effect risk including tolerance, dependence, potential withdrawal symptoms, and the potential  eventual dose-related risk of dementia.  Disc risk with alcohol and do not combine them.   NO STIMULANTS DT HX ABUSE. We discussed the potential of using a nonstimulant such as Strattera or more likely Qellbree in the future when his anxiety is better managed.  History of caffeine excess.   Follow-up 8 weeks  Meredith Staggers, MD, DFAPA   Future Appointments  Date Time Provider Department Center  11/21/2021  4:00 PM Cottle, Steva Ready., MD [facility] None  06/26/2022  8:30 AM Sondra Come, MD [facility] None    Orders Placed This Encounter  Procedures   Imipramine with  desipramine, blood       -------------------------------

## 2021-10-26 NOTE — Patient Instructions (Addendum)
1 at night for 5 nights, then 2 at night for 5 nights, then 3 at night Wait 7 days and get the blood test in the morning at [facility] labs

## 2021-10-30 ENCOUNTER — Telehealth: Payer: Self-pay | Admitting: Psychiatry

## 2021-10-30 ENCOUNTER — Other Ambulatory Visit: Payer: Self-pay | Admitting: Psychiatry

## 2021-10-30 DIAGNOSIS — F411 Generalized anxiety disorder: Secondary | ICD-10-CM

## 2021-10-30 MED ORDER — HYDROXYZINE PAMOATE 25 MG PO CAPS: 25.0000 mg | ORAL_CAPSULE | Freq: Three times a day (TID) | ORAL | 0 refills | Status: DC | PRN

## 2021-10-30 NOTE — Telephone Encounter (Signed)
Should he continue imipramine too?

## 2021-10-30 NOTE — Telephone Encounter (Signed)
No he should stop imipramine bc he had an allergic reaction to it.

## 2021-10-30 NOTE — Telephone Encounter (Addendum)
Pt called reporting the Imipramine 10 mg  new med he started. He is allergic. Started itching day 1, by the 3 day he had full body hives. Pt's sister told him she has to take Hydroxyzine for anxiety. Sister takes Hydroxyzine 50 mg -3/d. She gave him a few to try. He took 1 then, His hives went away. Took 1 later that day and 1 next morning. Stated he felt the best in a while. Asking if you thought that may be an option for him. [facility] [place]. Next apt 3/7 System went down in the middle of the first message.

## 2021-10-30 NOTE — Telephone Encounter (Signed)
*  He had the allergic reaction to imipramine.  That does happen sometimes.  Hydroxyzine coincidentally is effective for hives and lasts about 8 hours or so.  It also has an antianxiety effect.  I will send in a prescription for him to try this but 50 mg is a high dose so I want to give him 25 mg 3 times a day.

## 2021-10-30 NOTE — Telephone Encounter (Signed)
Please review

## 2021-10-31 ENCOUNTER — Ambulatory Visit
Admission: EM | Admit: 2021-10-31 | Discharge: 2021-10-31 | Disposition: A | Discharge status: Home / Self Care | Payer: 59 | Attending: Emergency Medicine | Admitting: Emergency Medicine

## 2021-10-31 ENCOUNTER — Other Ambulatory Visit: Payer: Self-pay

## 2021-10-31 ENCOUNTER — Telehealth: Payer: Self-pay

## 2021-10-31 DIAGNOSIS — Z0000 Encounter for general adult medical examination without abnormal findings: Secondary | ICD-10-CM | POA: Diagnosis not present

## 2021-10-31 DIAGNOSIS — R5383 Other fatigue: Secondary | ICD-10-CM | POA: Diagnosis present

## 2021-10-31 LAB — CBC WITH DIFFERENTIAL/PLATELET
Abs Immature Granulocytes: 0.01 10*3/uL (ref 0.00–0.07)
Basophils Absolute: 0 10*3/uL (ref 0.0–0.1)
Basophils Relative: 1 %
Eosinophils Absolute: 0.2 10*3/uL (ref 0.0–0.5)
Eosinophils Relative: 5 %
HCT: 43.3 % (ref 39.0–52.0)
Hemoglobin: 16 g/dL (ref 13.0–17.0)
Immature Granulocytes: 0 %
Lymphocytes Relative: 37 %
Lymphs Abs: 1.3 10*3/uL (ref 0.7–4.0)
MCH: 33.6 pg (ref 26.0–34.0)
MCHC: 37 g/dL — ABNORMAL HIGH (ref 30.0–36.0)
MCV: 91 fL (ref 80.0–100.0)
Monocytes Absolute: 0.4 10*3/uL (ref 0.1–1.0)
Monocytes Relative: 11 %
Neutro Abs: 1.6 10*3/uL — ABNORMAL LOW (ref 1.7–7.7)
Neutrophils Relative %: 46 %
Platelets: 140 10*3/uL — ABNORMAL LOW (ref 150–400)
RBC: 4.76 MIL/uL (ref 4.22–5.81)
RDW: 12.1 % (ref 11.5–15.5)
WBC: 3.5 10*3/uL — ABNORMAL LOW (ref 4.0–10.5)
nRBC: 0 % (ref 0.0–0.2)

## 2021-10-31 LAB — COMPREHENSIVE METABOLIC PANEL
ALT: 35 U/L (ref 0–44)
AST: 27 U/L (ref 15–41)
Albumin: 4.9 g/dL (ref 3.5–5.0)
Alkaline Phosphatase: 62 U/L (ref 38–126)
Anion gap: 10 (ref 5–15)
BUN: 14 mg/dL (ref 6–20)
CO2: 26 mmol/L (ref 22–32)
Calcium: 9.8 mg/dL (ref 8.9–10.3)
Chloride: 98 mmol/L (ref 98–111)
Creatinine, Ser: 1.06 mg/dL (ref 0.61–1.24)
GFR, Estimated: >60 mL/min (ref >60)
Glucose, Bld: 114 mg/dL — ABNORMAL HIGH (ref 70–99)
Potassium: 4.5 mmol/L (ref 3.5–5.1)
Sodium: 134 mmol/L — ABNORMAL LOW (ref 135–145)
Total Bilirubin: 1 mg/dL (ref 0.3–1.2)
Total Protein: 8.3 g/dL — ABNORMAL HIGH (ref 6.5–8.1)

## 2021-10-31 LAB — HIV ANTIBODY (ROUTINE TESTING W REFLEX): HIV Screen 4th Generation wRfx: NONREACTIVE

## 2021-10-31 LAB — TSH: TSH: 2.906 u[IU]/mL (ref 0.350–4.500)

## 2021-10-31 NOTE — Discharge Instructions (Addendum)
We will contact you if you HIV or synhilis testing come back positive. You will need to follow up with Dr. Yetta Barre for all other results.

## 2021-10-31 NOTE — ED Provider Notes (Signed)
HPI  SUBJECTIVE:  David Graham is a 36 y.o. male who presents with fatigue and "not feeling right" for the past 2 months.  He is in the process of having his anxiety medication adjusted during this time.  He is requesting comprehensive lab work, including STD testing.  he denies hemoptysis, melena, hematochezia, unintentional weight loss, night sweats, unintentional weight gain, hair skin changes, heat or cold intolerance.  He is sexually active, but only has 1 partner.  He has no GU complaints.  has been in his otherwise usual state of health and has no other complaints, other than getting a rash when he drinks alcohol.  He has a past medical history of anxiety.  No history of diabetes, thyroid disease, anemia,  HIV.  PMD: [facility].   Past Medical History:  Diagnosis Date   ADHD (attention deficit hyperactivity disorder)    Anxiety     History reviewed. No pertinent surgical history.  History reviewed. No pertinent family history.  Social History   Tobacco Use   Smoking status: Never   Smokeless tobacco: Former    Quit date: 10/31/2020   Tobacco comments:    occassional  Vaping Use   Vaping Use: Never used  Substance Use Topics   Alcohol use: Yes    Alcohol/week: 0.0 standard drinks   Drug use: No    No current facility-administered medications for this encounter.  Current Outpatient Medications:    clonazePAM (KLONOPIN) 0.5 MG tablet, Take 1 tablet (0.5 mg total) by mouth 3 (three) times daily as needed for anxiety., Disp: 90 tablet, Rfl: 5   diazepam (VALIUM) 5 MG tablet, Take 1 tablet (5 mg total) by mouth in the morning, at noon, and at bedtime., Disp: 90 tablet, Rfl: 0   hydrOXYzine (VISTARIL) 25 MG capsule, Take 1 capsule (25 mg total) by mouth 3 (three) times daily as needed for anxiety or itching., Disp: 90 capsule, Rfl: 0   sertraline (ZOLOFT) 20 MG/ML concentrated solution, Take by mouth daily. 20 mg, Disp: , Rfl:    tadalafil (CIALIS) 5 MG tablet, Take 1  tablet (5 mg total) by mouth daily as needed for erectile dysfunction., Disp: 10 tablet, Rfl: 0  Allergies  Allergen Reactions   Imipramine Hives   Oxcarbazepine    Viibryd [Vilazodone Hcl]      ROS  As noted in HPI.   Physical Exam  BP (!) 140/98 (BP Location: Left Arm)    Pulse 67    Temp 99.3 F (37.4 C) (Oral)    Resp 16    SpO2 98%   Constitutional: Well developed, well nourished, no acute distress Eyes:  EOMI, conjunctiva normal bilaterally HENT: Normocephalic, atraumatic,mucus membranes moist Neck: Nontender, normal size thyroid.  No cervical lymphadenopathy Respiratory: Normal inspiratory effort, lungs clear bilaterally Cardiovascular: Normal rate, regular rate, no murmurs, rubs, gallops GI: nondistended soft, nontender, no splenomegaly skin: No rash, skin intact Musculoskeletal: no deformities Neurologic: Alert & oriented x 3, no focal neuro deficits Psychiatric: Speech and behavior appropriate   ED Course   Medications - No data to display  Orders Placed This Encounter  Procedures   CBC with Differential    Standing Status:   Standing    Number of Occurrences:   1   Comprehensive metabolic panel    Standing Status:   Standing    Number of Occurrences:   1   Results for orders placed or performed during the hospital encounter of 10/31/21  CBC with Differential  Result Value Ref Range   WBC 3.5 (L) 4.0 - 10.5 K/uL   RBC 4.76 4.22 - 5.81 MIL/uL   Hemoglobin 16.0 13.0 - 17.0 g/dL   HCT 43.3 39.0 - 52.0 %   MCV 91.0 80.0 - 100.0 fL   MCH 33.6 26.0 - 34.0 pg   MCHC 37.0 (H) 30.0 - 36.0 g/dL   RDW 12.1 11.5 - 15.5 %   Platelets 140 (L) 150 - 400 K/uL   nRBC 0.0 0.0 - 0.2 %   Neutrophils Relative % 46 %   Neutro Abs 1.6 (L) 1.7 - 7.7 K/uL   Lymphocytes Relative 37 %   Lymphs Abs 1.3 0.7 - 4.0 K/uL   Monocytes Relative 11 %   Monocytes Absolute 0.4 0.1 - 1.0 K/uL   Eosinophils Relative 5 %   Eosinophils Absolute 0.2 0.0 - 0.5 K/uL   Basophils  Relative 1 %   Basophils Absolute 0.0 0.0 - 0.1 K/uL   Immature Granulocytes 0 %   Abs Immature Granulocytes 0.01 0.00 - 0.07 K/uL  Comprehensive metabolic panel  Result Value Ref Range   Sodium 134 (L) 135 - 145 mmol/L   Potassium 4.5 3.5 - 5.1 mmol/L   Chloride 98 98 - 111 mmol/L   CO2 26 22 - 32 mmol/L   Glucose, Bld 114 (H) 70 - 99 mg/dL   BUN 14 6 - 20 mg/dL   Creatinine, Ser 1.06 0.61 - 1.24 mg/dL   Calcium 9.8 8.9 - 10.3 mg/dL   Total Protein 8.3 (H) 6.5 - 8.1 g/dL   Albumin 4.9 3.5 - 5.0 g/dL   AST 27 15 - 41 U/L   ALT 35 0 - 44 U/L   Alkaline Phosphatase 62 38 - 126 U/L   Total Bilirubin 1.0 0.3 - 1.2 mg/dL   GFR, Estimated >60 >60 mL/min   Anion gap 10 5 - 15  RPR  Result Value Ref Range   RPR Ser Ql NON REACTIVE NON REACTIVE  HIV Antibody (routine testing w rflx)  Result Value Ref Range   HIV Screen 4th Generation wRfx Non Reactive Non Reactive  TSH  Result Value Ref Range   TSH 2.906 0.350 - 4.500 uIU/mL     No results found for this or any previous visit (from the past 24 hour(s)). No results found.  ED Clinical Impression  1. Other fatigue   2. Normal physical exam      ED Assessment/Plan  Patient requesting CBC, CMP, TSH, HIV RPR testing.  Discussed with patient and parent that we will not be able to follow-up on any abnormal lab work other than STD testing, but he states that he will follow-up with his primary care provider if these are abnormal.  HIV, RPR negative, TSH normal.  CBC, CMP unremarkable.  Discussed labs,  MDM,  and plan for follow-up with patient.  patient agrees with plan.   No orders of the defined types were placed in this encounter.     *This clinic note was created using Dragon dictation software. Therefore, there may be occasional mistakes despite careful proofreading.  ?    Melynda Ripple, MD 11/01/21 1207

## 2021-10-31 NOTE — Telephone Encounter (Signed)
Patient came into office stating he just left urgent care.  Stated he had been feeling tired & wasn't sure we could see him.  He stated he had some labs drawn & wanted to go over them.  Explained to pt that the labs aren't back & that there wasn't a need to make an appt today for the labs.  Pt stated he has been breaking out when drinking.  I told him that give it a couple days for the labs to come back & make an appt if needed.

## 2021-10-31 NOTE — ED Triage Notes (Signed)
Patient presents to Urgent Care with complaints of generalized fatigue x 2 months, STD screening, HIV testing, requesting CBC/CMP, states his last well check last year.

## 2021-10-31 NOTE — Telephone Encounter (Signed)
Called pt and explained that some lab tests take a couple of days to come back

## 2021-11-01 LAB — RPR: RPR Ser Ql: NONREACTIVE

## 2021-11-01 NOTE — Telephone Encounter (Signed)
Pt wants to know if he can get a rx for mirtazapine.He stated it is very helpful, I don't see it on his med list so I wanted to make sure first

## 2021-11-01 NOTE — Telephone Encounter (Signed)
My understanding from the last appointment was he did not think the mirtazapine helped anything and so we stopped it.  He is now wanting the mirtazapine prescription?  What is he feel it has helped?  Did not help anxiety?  Did not help sleep? I didn't think it had helped anything.

## 2021-11-01 NOTE — Telephone Encounter (Signed)
Pt stated when he stopped taking it he realized that it has been helping.It helps him stay calm and it helps sleep as well.[facility] [place]

## 2021-11-02 ENCOUNTER — Other Ambulatory Visit: Payer: Self-pay | Admitting: Psychiatry

## 2021-11-02 MED ORDER — MIRTAZAPINE 15 MG PO TABS: 15.0000 mg | ORAL_TABLET | Freq: Every day | ORAL | 0 refills | Status: DC

## 2021-11-02 NOTE — Telephone Encounter (Signed)
Let him know RX sent 

## 2021-11-02 NOTE — Telephone Encounter (Signed)
Pt informed

## 2021-11-06 ENCOUNTER — Telehealth: Payer: Self-pay | Admitting: Psychiatry

## 2021-11-06 NOTE — Telephone Encounter (Signed)
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 He would like to know if he can have an extended release stimulant to help him focus and concentrate. He was thinking about ritalin xr. He has an appt 3/7. His phone number is [phone]

## 2021-11-06 NOTE — Telephone Encounter (Signed)
See message from patient. I read your note from 2/9 and it states no stimulants DT hx of abuse.  He is asking to try Concerta. I told him that you would consider nonstimulants - Strattera or Qelbree.  He has an appt with you on 3/7 and he thought he might could get started on something now so he could report to you at that appt.  He was seeming trying to convince me that you were agreeable to one more try of stimulants. I told him that you had to sign off on them and that I had no control over whether or not he was able to get anything. I also reiterated what you said in your note.

## 2021-11-06 NOTE — Telephone Encounter (Signed)
No stimulants.  Ok trial Qellbree 200 mg daily for 1 week then 2 of 200 mg capsules daily.  He can pick up samples

## 2021-11-07 NOTE — Telephone Encounter (Signed)
Patient notified of recommendations, samples pulled.

## 2021-11-14 ENCOUNTER — Other Ambulatory Visit: Payer: Self-pay | Admitting: Psychiatry

## 2021-11-21 ENCOUNTER — Other Ambulatory Visit: Payer: Self-pay | Admitting: Psychiatry

## 2021-11-21 ENCOUNTER — Other Ambulatory Visit: Payer: Self-pay

## 2021-11-21 ENCOUNTER — Encounter: Payer: Self-pay | Admitting: Psychiatry

## 2021-11-21 ENCOUNTER — Ambulatory Visit: Payer: 59 | Admitting: Psychiatry

## 2021-11-21 DIAGNOSIS — F411 Generalized anxiety disorder: Secondary | ICD-10-CM

## 2021-11-21 DIAGNOSIS — F39 Unspecified mood [affective] disorder: Secondary | ICD-10-CM

## 2021-11-21 DIAGNOSIS — F902 Attention-deficit hyperactivity disorder, combined type: Secondary | ICD-10-CM

## 2021-11-21 DIAGNOSIS — F4001 Agoraphobia with panic disorder: Secondary | ICD-10-CM

## 2021-11-21 NOTE — Progress Notes (Signed)
David Graham [id number] 07/25/1986 36 y.o.     Subjective:   Patient ID:  David Graham is a 36 y.o. (DOB 1985-12-03) male.  Chief Complaint:  Chief Complaint  Patient presents with   Follow-up   Anxiety   ADHD    Anxiety Symptoms include decreased concentration and nervous/anxious behavior. Patient reports no chest pain, confusion, palpitations or suicidal ideas.    David Graham  Follow up for intolerable anxiety.  AT visit 01/07/2019 and was to start sertraline solution.  Increased sertaline to 1.5 ml of Zoloft solution from 57ml.    His visit February 17, 2019.  His anxiety was significantly improved with the sertraline suspension at 30 mg daily.  Called 03/04/19 wanting to try buspirone and 10 mg BID sent in.  05/2019 visit with following noted: He felt it elevated anxiety and stopped it. Still on sertraline 30 mg suspension.  Klonopin 0.5 mg up to 3 daily but tries to limit it.  Average 2 daily.  He feels he can't go later.   Example meetings trigger anxiety. He doesn't see SE.  Taking more logical steps to get his life under better control and going pretty well.  Doesn't want to change meds. Anxiety has been alright and about the same as when last seen.   Feels he had less anxiety when he was taking Adderall.  He felt more confident.  Was more effective at work.  Was tested for ADD in the past and had a positive. 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  My mind was sharper than it is now. Asking for alternative ADD medication Not depressed.   No memory complaints.  Appetite is good.  Exercising.  Sleeping well.  Not having panic attacks now.  Has normal levels of anxiety and manages his anxiety much better. Plan: Start modafinil 200 mg tablet one half every morning for 7 days then 1 every morning if needed.  Do not exceed this recommended dose. Continue sertraline 30 mg daily Continue clonazepam but try to limit the dosage for cognitive reasons.   06/19/2019 phone call  from patient stating that the modafinil was not working and he wanted to try a stimulant like Adderall.  This was refused because he has a history of Adderall abuse.  The other option mentioned was Strattera.  He refused Strattera and stated he wanted to change doctors.  08/31/2020 appointment with the following noted: Not necessarily looking for med changes. As can be seen the patient has been instructed to follow-up 2 to 3 months after the September 2020 appointment but did not do so. PDMP indicates patient is getting methyl fendedate 20 mg tablets number 60/month and clonazepam 0.5 mg tablets number 90/month from another provider.  In September he had an Adderall prescription for 20 mg tablets number 60/month. Last filled Ritalin and lonazepam 0.5 #90 in 07/13/20. Decided to come back bc of cost reasons.  Had been [place] NP in [place]. Taking clonazepam 0.5 mg TID, Ritalin 20 BID, Zoloft and they were charging for drugtests  Thinks Ritalin wears off in about 2 daily. Typically taking clonazepam 0.5 mg 2-3 daily.  If forgets bc would get withdrawal. Typically taking 40 mg Ritalin daily.  Denies abuse.  Nothing like taking Adderall. Zoloft 12.5 builds up in his system and then hazes his thinking.  So he can't take it every day Work same job for 7 mos and things going OK.  Life is alright. Manageable rare panic.  Not depressed.  Was having less anxiety until started working with coworker who is stressful.  Anxiety is not debilitating.   Provided documents from age 36 showing history of ADD and feels he needs Ritalin to pass the tests. Works for [employer] and subject to random drug tests.  No drug abuse.   Was getting drug tests with last provider. Plan: Continue sertraline 12.5 mg daily Continue clonazepam but try to limit the dosage for cognitive reasons.   11/03/2020 phone call from patient: Patient called with concern about his Zoloft medication. He states that he experiencing a  delayed orgasm, and it is becoming a problem in sexual activities. He also says that he read online that the medication Buspar when combined with the Zoloft will stop this problem. He would like to know if you would recommend him trying the Buspar. MD response: CC   5:49 PM Note Please let him know that I agreed to the trial of the buspirone to reduce sexual side effects from sertraline.  He can try half a tablet twice daily.  It should work within a week or 2.  If it does not help then he can increase to 1 tablet twice daily.  Buspirone also has its on antianxiety effects which may be helpful as well for his overall anxiety level.     11/29/2020 appointment with the following noted: Pretty good with anxierty and depression. Disc BP issues but took preworkout supplements and Ritalin and just came from the gym.  Did feel a little more benefit of sertraline by being more consistent.  Admits historically not consistent with sertraline but is now.  Now has less mood swings and irrtiablity by being more consistent.  1-2 mg increase is notable but causes  More sexual SE.   With buspirone didn't help the delayed orgasm.  Increased buspirone to 10 mg BID. No change in anxiety either.   Zoloft doesn't reduce libido but only orgasm.   Typically taking clonazepam TID for sleep and anxiety.  Plan no changes  03/15/21 appt noted: Taking sertraline about 10-15 mg he thinks or about 1/2 ml. Pretty good overall but working a lot. Still takes Ritalin 20 BID usually which is pretty mild.  Takes it daily. Takes clonazepam TID still.   Takes buspirone prn. Sexual Se sertraline but not anything else. Sleep is good.   [place] stopped drinking 3 weeks ago and started to feel better.  Was drinking regularly before. BP went up and now back to normal. No sig avoidance. No DUI but family was concerned about his drinking.  08/14/2021 appointment with the following noted: Start Ritalin because he felt that he was  becoming dependent on it. Continues clonazepam 0.5 mg 3 times daily as needed anxiety Continues mirtazapine 15 mg nightly Stopped sertraline when started the mirtazapine DT sexual SE. When up on mirtazapine to 22.5 mg to help with anxiety more. Not depressed. Wants to focus on anxiety.  Heard that Anne NgSerzone is available.  08/14/2021 phone call saying nefazodone was not available and he wanted to try trazodone for anxiety.  We started 25 mg daily and increased to 50 mg.  He called back the next day stating that the trazodone made him sick. He wanted to try something else for anxiety and paroxetine was sent.  5 then 10 mg nightly.  10/23/2021 phone call complaining that Klonopin is not helping anymore and he developed tolerance. Suggested a trial of clonidine 0.1 mg tablets 1/2-1 twice daily off label for anxiety. He called  back the next day stating that it seemed to cause heart palpitations when he was at the gym and he was not able to think is clearly.  Was told that he could try the half tablet dosage until his appointment.  10/26/2021 appointment with the following noted: Since being here he has tried trazodone, paroxetine and clonidine and is not taking any of those. He is taking clonazepam 0.5 mg 3 times daily. No relief with anxiety and drinking too much.  When drinks can drink 12 pack per day and then tries to stop. Promotion at work and new GF and should be OK but miserable. Working to find a Paramedic. Now developed fear of blushing and realizes it is weird. Also still feels need to deal with ADD.   No pot and didn't like it.   Only think that took away anxiety was stimulants.  But admits he overtook them. Plan: DC mirtazapine DT NR   Trial imipramine for TR anxiety  11/06/2021 phone call patient asking for long-acting stimulants.  He had admitted to prior abuse of stimulants that were short acting.  No stimulants were prescribed. It was agreed he could try Vaughan Basta which is a  nonstimulant for ADD.  11/21/21 appt noted: He called back after stopping mirtazapine and said it did help with sleep and maybe anxiety somewhat and he wanted to restart it. He is taking clonazepam 0.5 mg 3 times daily, hydroxyzine 25 mg 3 times daily for anxiety, mirtazapine 15 mg nightly, sertraline solution a few mg. Had rash reaction to stress today.  Didn't itch.   New job is too stressful.   Uses hydroxyzine helps some and uses prn. I feel so stressed can hardly stand it. Asks about increasing clonazepam   Past psych med intolerances : Fluoxetine, Lexapro caused GI side effects, sertraline most ever 50mg ,  duloxetine SE sexual, Pristiq,  Viibryd allergic,  Imipramine allergic rash Paroxetine SE  Clonidine SE PALPITATIONS Mirtazapine Trazodone nausea hydroxyzine Trileptal caused a rash, Depakote. Lamotrigine Vraylar worst one ever,  Gabapentin made him feel weird.   Hx Adderall abuse.  Ritalin Very brief qellbree ? Dose claims NR Modafinil NR Wellbutrin worst of all.   Xanax and Ativan poor response. Diazepam 5-10  ok, clonazepam  Review of Systems:  Review of Systems  Cardiovascular:  Negative for chest pain and palpitations.  Skin:  Positive for rash.  Neurological:  Negative for tremors, weakness and headaches.  Psychiatric/Behavioral:  Positive for decreased concentration. Negative for agitation, behavioral problems, confusion, dysphoric mood, hallucinations, self-injury, sleep disturbance and suicidal ideas. The patient is nervous/anxious. The patient is not hyperactive.    Medications: I have reviewed the patient's current medications.  Current Outpatient Medications  Medication Sig Dispense Refill   clonazePAM (KLONOPIN) 0.5 MG tablet Take 1 tablet (0.5 mg total) by mouth 3 (three) times daily as needed for anxiety. 90 tablet 5   hydrOXYzine (VISTARIL) 25 MG capsule Take 1 capsule (25 mg total) by mouth 3 (three) times daily as needed for anxiety or itching. 90  capsule 0   mirtazapine (REMERON) 15 MG tablet Take 1 tablet (15 mg total) by mouth at bedtime. 90 tablet 0   sertraline (ZOLOFT) 20 MG/ML concentrated solution Take by mouth daily. 20 mg     tadalafil (CIALIS) 5 MG tablet Take 1 tablet (5 mg total) by mouth daily as needed for erectile dysfunction. 10 tablet 0   No current facility-administered medications for this visit.    Medication Side Effects: mild delayed  ejaculation. Not a big problem.  Night sweats resolved. Tends to have a lot of SE from meds.  Med sensitive.  Allergies:  Allergies  Allergen Reactions   Imipramine Hives   Oxcarbazepine    Viibryd [Vilazodone Hcl]     Past Medical History:  Diagnosis Date   ADHD (attention deficit hyperactivity disorder)    Anxiety     History reviewed. No pertinent family history.  Social History   Socioeconomic History   Marital status: Single    Spouse name: Not on file   Number of children: Not on file   Years of education: Not on file   Highest education level: Not on file  Occupational History   Not on file  Tobacco Use   Smoking status: Never   Smokeless tobacco: Former    Quit date: 10/31/2020   Tobacco comments:    occassional  Vaping Use   Vaping Use: Never used  Substance and Sexual Activity   Alcohol use: Yes    Alcohol/week: 0.0 standard drinks   Drug use: No   Sexual activity: Not Currently  Other Topics Concern   Not on file  Social History Narrative   Not on file   Social Determinants of Health   Financial Resource Strain: Not on file  Food Insecurity: Not on file  Transportation Needs: Not on file  Physical Activity: Not on file  Stress: Not on file  Social Connections: Not on file  Intimate Partner Violence: Not on file    Past Medical History, Surgical history, Social history, and Family history were reviewed and updated as appropriate.   Please see review of systems for further details on the patient's review from today.   Objective:    Physical Exam:  There were no vitals taken for this visit.  Physical Exam Constitutional:      General: He is not in acute distress.    Appearance: He is well-developed.  Musculoskeletal:        General: No deformity.  Neurological:     Mental Status: He is alert and oriented to person, place, and time.     Coordination: Coordination normal.  Psychiatric:        Attention and Perception: He is attentive.        Mood and Affect: Mood is anxious. Mood is not depressed. Affect is not labile, blunt, angry or inappropriate.        Speech: Speech normal.        Behavior: Behavior normal.        Thought Content: Thought content normal. Thought content is not paranoid or delusional. Thought content does not include homicidal or suicidal ideation.        Cognition and Memory: Cognition normal.        Judgment: Judgment normal.     Comments: Insight is improved and judgment better about use of meds and compliance. Chronic anxiety     Lab Review:     Component Value Date/Time   NA 134 (L) 10/31/2021 0915   NA 137 04/12/2021 1058   NA 136 09/25/2013 1151   K 4.5 10/31/2021 0915   K 4.1 09/25/2013 1151   CL 98 10/31/2021 0915   CL 103 09/25/2013 1151   CO2 26 10/31/2021 0915   CO2 31 09/25/2013 1151   GLUCOSE 114 (H) 10/31/2021 0915   GLUCOSE 75 09/25/2013 1151   BUN 14 10/31/2021 0915   BUN 11 04/12/2021 1058   BUN 13 09/25/2013 1151   CREATININE 1.06  10/31/2021 0915   CREATININE 1.10 09/25/2013 1151   CALCIUM 9.8 10/31/2021 0915   CALCIUM 9.7 09/25/2013 1151   PROT 8.3 (H) 10/31/2021 0915   PROT 7.6 04/12/2021 1055   PROT 8.4 (H) 09/25/2013 1151   ALBUMIN 4.9 10/31/2021 0915   ALBUMIN 5.2 (H) 04/12/2021 1058   ALBUMIN 4.4 09/25/2013 1151   AST 27 10/31/2021 0915   AST 37 09/25/2013 1151   ALT 35 10/31/2021 0915   ALT 32 09/25/2013 1151   ALKPHOS 62 10/31/2021 0915   ALKPHOS 81 09/25/2013 1151   BILITOT 1.0 10/31/2021 0915   BILITOT 1.0 04/12/2021 1055   BILITOT  0.6 09/25/2013 1151   GFRNONAA >60 10/31/2021 0915   GFRNONAA >60 09/25/2013 1151   GFRAA 108 04/02/2019 1040   GFRAA >60 09/25/2013 1151       Component Value Date/Time   WBC 3.5 (L) 10/31/2021 0915   RBC 4.76 10/31/2021 0915   HGB 16.0 10/31/2021 0915   HGB 15.2 04/02/2019 1040   HCT 43.3 10/31/2021 0915   HCT 43.2 04/02/2019 1040   PLT 140 (L) 10/31/2021 0915   PLT 136 (L) 04/02/2019 1040   MCV 91.0 10/31/2021 0915   MCV 92 04/02/2019 1040   MCV 89 09/25/2013 1151   MCH 33.6 10/31/2021 0915   MCHC 37.0 (H) 10/31/2021 0915   RDW 12.1 10/31/2021 0915   RDW 12.9 04/02/2019 1040   RDW 12.7 09/25/2013 1151   LYMPHSABS 1.3 10/31/2021 0915   LYMPHSABS 1.1 04/02/2019 1040   LYMPHSABS 1.5 09/25/2013 1151   MONOABS 0.4 10/31/2021 0915   MONOABS 0.7 09/25/2013 1151   EOSABS 0.2 10/31/2021 0915   EOSABS 0.2 04/02/2019 1040   EOSABS 0.3 09/25/2013 1151   BASOSABS 0.0 10/31/2021 0915   BASOSABS 0.0 04/02/2019 1040   BASOSABS 0.1 09/25/2013 1151    No results found for: POCLITH, LITHIUM   No results found for: PHENYTOIN, PHENOBARB, VALPROATE, CBMZ   .res Assessment: Plan:    Generalized anxiety disorder  Panic disorder with agoraphobia  Episodic mood disorder (HCC)  Attention deficit hyperactivity disorder (ADHD), combined type  Hx Adderall Abuse in remission Rule out OCD  Greater than 50% of 30-minute face to face time with patient was spent on counseling and coordination of care. We discussed the following: Overall Andrews anxiety was improved with ultra low dose sertraline.  But he became frustrated with sexual SE and feeling a little numbed generally.  Continue mirtazapine 15 mg HS  PDMP shows patient picking up clonazepam but stopped methylphenidate monthly but no controlled substances from other providers.  Continue clonazepam 0.5 mg 3 times daily as needed.  Any generic except Actavis which works less well.  As the anxiety improves gradually reduce the dosage  of clonazepam.  Encouraged is low the use of this is absolutely possible given his concerns about attention and focus.  Discussed how this can interfere with attention and focus even if he were to take a stimulant. change clonazepam to diazepam unsuccessful so back on clonazepam 0.5 mg TID  DT severe TR anxiety option olanzapine.  He doesn't want to try anything else.  We discussed the short-term risks associated with benzodiazepines including sedation and increased fall risk among others.  Discussed long-term side effect risk including tolerance, dependence, potential withdrawal symptoms, and the potential eventual dose-related risk of dementia.  Disc risk with alcohol and do not combine them. No evidence for abuse of benzodiazepines.   NO STIMULANTS DT HX ABUSE. We discussed the potential  of using a nonstimulant such as Strattera or more likely Qellbree in the future when his anxiety is better managed.  History of caffeine excess.   Rec counseling for ongoing anxiety  Follow-up 3-4 mos  Meredith Staggersarey Cottle, MD, DFAPA   Future Appointments  Date Time Provider Department Center  06/26/2022  8:30 AM Sondra ComeSninsky, Brian C, MD [facility] None    No orders of the defined types were placed in this encounter.      -------------------------------

## 2021-11-22 ENCOUNTER — Other Ambulatory Visit: Payer: Self-pay

## 2021-11-22 ENCOUNTER — Telehealth: Payer: Self-pay | Admitting: Psychiatry

## 2021-11-22 MED ORDER — SERTRALINE HCL 20 MG/ML PO CONC: 20.0000 mg | Freq: Every day | ORAL | 0 refills | Status: DC

## 2021-11-22 NOTE — Telephone Encounter (Signed)
I can send in the sertraline. Patient last filled 90 tablets of clonazepam on 2/24. You just saw him yesterday and now he is asking to increase the clonazepam to 1 mg.  ?

## 2021-11-22 NOTE — Telephone Encounter (Signed)
Pt called @ 1:35 reporting he needs Rx Sertraline 20 mg/ml -oral drops. After looking, only half bottle left. Also,  after thinking about it, Pt would like you to increase  Clonazepam from 0.5 mg to 1 mg. [facility] [place],  ?

## 2021-11-22 NOTE — Telephone Encounter (Signed)
Patient has not had an appointment with me yesterday.  He had ample opportunity to discuss any concerns he had at that time but chose not to do so.  He voiced dissatisfaction with treatment here.  It is in his best interest to seek treatment elsewhere.  I will provide refills of current medicines at current dosages when they are due only until May 15.  I will not agree to an increase in clonazepam.  He needs to find another provider within that length of time so that he does not experience withdrawal from stopping medicines. He will receive a letter from me stating this information within the next week or so. ?

## 2021-11-22 NOTE — Telephone Encounter (Signed)
Patient called back. He said an Rx had been sent for hydroxyzine and he had not asked for that. I told him that I could not do anything about the clonazepam because it was controlled and you would have to approve it. He wanted to know if you were going to approve it or not and if not he would seek care elsewhere. He said you saw how he was yesterday and he is starting a new job and how could you not help him out. He is also upset that you will not prescribe stimulants. He expressed his displeasure multiple times and was not in a state of mind to listen to anything I had to say, just kept asking if you were going to say no to increasing the clonazepam, that he needed to find someone else who could help him otherwise.  ?

## 2021-11-23 NOTE — Telephone Encounter (Signed)
Patient was notified.

## 2021-11-24 ENCOUNTER — Other Ambulatory Visit: Payer: Self-pay | Admitting: Psychiatry

## 2021-11-24 ENCOUNTER — Telehealth: Payer: Self-pay | Admitting: Family Medicine

## 2021-11-24 ENCOUNTER — Telehealth: Payer: Self-pay

## 2021-11-24 NOTE — Telephone Encounter (Signed)
Copied from CRM #[id number]. Topic: Referral - Request for Referral ?>> Nov 24, 2021  9:22 AM Johnson, Chaz E wrote: ?Has patient seen PCP for this complaint? Yes  ?*If NO, is insurance requiring patient see PCP for this issue before PCP can refer them? ?Referral for which specialty: Psychiatry  ?Preferred provider/office: [facility] on [address]  ?Reason for referral: Pt wants a new Provider in [place] instead of the office he now goes to in [place] / office needs a referral to see the pt ?

## 2021-11-24 NOTE — Telephone Encounter (Signed)
I returned pt's call and gave him the info for walk-in at [facility] and [place] in [place]. He stated he needed "a little more than that." I explained that we have pt's that are established with [facility] so it is not a one and done. I also explained that it is hard to find a psychiatrist as they are few and far between in this area. He currently sees Dr. Clovis Pu in [place]. Pt stated he will "figure something out." ?

## 2021-11-24 NOTE — Telephone Encounter (Signed)
Termination letter dictated ?

## 2021-11-24 NOTE — Telephone Encounter (Signed)
Copied from [place]. Topic: Referral - Request for Referral ?>> Nov 24, 2021  9:22 AM Alanda Slim E wrote: ?Has patient seen PCP for this complaint? Yes  ?*If NO, is insurance requiring patient see PCP for this issue before PCP can refer them? ?Referral for which specialty: Psychiatry  ?Preferred provider/office: [facility] on [address] for referral: Pt wants a new Provider in [place] instead of the office he now goes to in [place] / office needs a referral to see the pt ?

## 2021-12-17 ENCOUNTER — Other Ambulatory Visit: Payer: Self-pay | Admitting: Psychiatry

## 2022-01-10 ENCOUNTER — Encounter: Payer: Self-pay | Admitting: Family Medicine

## 2022-01-27 ENCOUNTER — Other Ambulatory Visit: Payer: Self-pay | Admitting: Psychiatry

## 2022-02-15 ENCOUNTER — Telehealth: Payer: Self-pay | Admitting: Family Medicine

## 2022-02-15 NOTE — Telephone Encounter (Signed)
Copied from [place]. Topic: Referral - Request for Referral >> Feb 15, 2022  2:08 PM Valere Dross wrote: Reason for CRM: Pt called in wanting to see about getting another ortho surgeon referral for his knee, pt states he believed it has messed up again, please advise.

## 2022-02-16 ENCOUNTER — Ambulatory Visit: Payer: 59 | Admitting: Family Medicine

## 2022-02-16 ENCOUNTER — Ambulatory Visit
Admission: RE | Admit: 2022-02-16 | Discharge: 2022-02-16 | Disposition: A | Discharge status: Home / Self Care | Payer: 59 | Attending: Family Medicine | Admitting: Family Medicine

## 2022-02-16 ENCOUNTER — Encounter: Payer: Self-pay | Admitting: Family Medicine

## 2022-02-16 ENCOUNTER — Ambulatory Visit
Admission: RE | Admit: 2022-02-16 | Discharge: 2022-02-16 | Disposition: A | Discharge status: Home / Self Care | Payer: 59 | Source: Ambulatory Visit | Attending: Family Medicine | Admitting: Family Medicine

## 2022-02-16 VITALS — BP 102/60 | HR 58 | Ht 71.0 in | Wt 225.0 lb

## 2022-02-16 DIAGNOSIS — G8929 Other chronic pain: Secondary | ICD-10-CM | POA: Diagnosis not present

## 2022-02-16 DIAGNOSIS — M25562 Pain in left knee: Secondary | ICD-10-CM

## 2022-02-16 MED ORDER — MELOXICAM 15 MG PO TABS: 15.0000 mg | ORAL_TABLET | Freq: Every day | ORAL | 3 refills | Status: DC

## 2022-02-16 NOTE — Patient Instructions (Signed)

## 2022-02-16 NOTE — Addendum Note (Signed)
Addended by: Everitt Amber on: 02/16/2022 08:31 AM   Modules accepted: Orders

## 2022-02-16 NOTE — Progress Notes (Signed)
Date:  02/16/2022   Name:  David Graham   DOB:  April 23, 1986   MRN:  [id number]   Chief Complaint: Knee Pain (Knee "scoped" 10 years ago- had meniscal surgery "years ago"- )  Knee Pain  There was no injury mechanism. The pain is present in the left knee. The quality of the pain is described as aching. The pain is moderate. The pain has been Intermittent since onset. Pertinent negatives include no inability to bear weight. He has tried NSAIDs for the symptoms. The treatment provided moderate relief.   Lab Results  Component Value Date   NA 134 (L) 10/31/2021   K 4.5 10/31/2021   CO2 26 10/31/2021   GLUCOSE 114 (H) 10/31/2021   BUN 14 10/31/2021   CREATININE 1.06 10/31/2021   CALCIUM 9.8 10/31/2021   EGFR 88 04/12/2021   GFRNONAA >60 10/31/2021   Lab Results  Component Value Date   CHOL 250 (H) 04/12/2021   HDL 59 04/12/2021   LDLCALC 168 (H) 04/12/2021   TRIG 130 04/12/2021   Lab Results  Component Value Date   TSH 2.906 10/31/2021   No results found for: HGBA1C Lab Results  Component Value Date   WBC 3.5 (L) 10/31/2021   HGB 16.0 10/31/2021   HCT 43.3 10/31/2021   MCV 91.0 10/31/2021   PLT 140 (L) 10/31/2021   Lab Results  Component Value Date   ALT 35 10/31/2021   AST 27 10/31/2021   ALKPHOS 62 10/31/2021   BILITOT 1.0 10/31/2021   No results found for: 25OHVITD2, 25OHVITD3, VD25OH   Review of Systems  Respiratory:  Negative for chest tightness and shortness of breath.   Cardiovascular:  Negative for chest pain.  Genitourinary:  Negative for difficulty urinating and dysuria.   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  Social History   Tobacco Use   Smoking status: Never   Smokeless tobacco: Former    Quit date: 10/31/2020    Tobacco comments:    occassional  Vaping Use   Vaping Use: Never used  Substance Use Topics   Alcohol use: Yes    Alcohol/week: 0.0 standard drinks   Drug use: No     Medication list has been reviewed and updated.  Current Meds  Medication Sig   clonazePAM (KLONOPIN) 0.5 MG tablet Take 1 tablet (0.5 mg total) by mouth 3 (three) times daily as needed for anxiety.   hydrOXYzine (VISTARIL) 25 MG capsule TAKE 1 CAPSULE (25 MG TOTAL) BY MOUTH 3 (THREE) TIMES DAILY AS NEEDED FOR ANXIETY OR ITCHING.   mirtazapine (REMERON) 15 MG tablet TAKE 1 TABLET BY MOUTH EVERYDAY AT BEDTIME   tadalafil (CIALIS) 5 MG tablet Take 1 tablet (5 mg total) by mouth daily as needed for erectile dysfunction.   [DISCONTINUED] sertraline (ZOLOFT) 20 MG/ML concentrated solution TAKE 1 ML (20 MG TOTAL) BY MOUTH DAILY.       02/16/2022    8:01 AM 04/12/2021    9:54 AM 02/12/2020   10:28 AM  GAD 7 : Generalized Anxiety Score  Nervous, Anxious, on Edge 0 0 1  Control/stop worrying 0 0 1  Worry too much - different things 0 0 1  Trouble relaxing 0 0 1  Restless  0 0 0  Easily annoyed or irritable 0 0 1  Afraid - awful might happen 0 0 0  Total GAD 7 Score 0 0 5  Anxiety Difficulty Not difficult at all  Not difficult at all       02/16/2022    8:01 AM  Depression screen PHQ 2/9  Decreased Interest 0  Down, Depressed, Hopeless 0  PHQ - 2 Score 0  Altered sleeping 0  Tired, decreased energy 0  Change in appetite 0  Feeling bad or failure about yourself  0  Trouble concentrating 0  Moving slowly or fidgety/restless 0  Suicidal thoughts 0  PHQ-9 Score 0  Difficult doing work/chores Not difficult at all    BP Readings from Last 3 Encounters:  02/16/22 102/60  10/31/21 (!) 140/98  06/20/21 139/83    Physical Exam Vitals and nursing note reviewed.  HENT:     Head: Normocephalic.     Right Ear: Tympanic membrane and external ear normal.     Left Ear: Tympanic membrane and external ear normal.      Nose: Nose normal.  Eyes:     General: No scleral icterus.       Right eye: No discharge.        Left eye: No discharge.     Conjunctiva/sclera: Conjunctivae normal.     Pupils: Pupils are equal, round, and reactive to light.  Neck:     Thyroid: No thyromegaly.     Vascular: No JVD.     Trachea: No tracheal deviation.  Cardiovascular:     Rate and Rhythm: Normal rate and regular rhythm.     Heart sounds: Normal heart sounds, S1 normal and S2 normal. No murmur heard. No systolic murmur is present.  No diastolic murmur is present.    No friction rub. No gallop. No S3 or S4 sounds.  Pulmonary:     Effort: No respiratory distress.     Breath sounds: Normal breath sounds. No wheezing or rales.  Abdominal:     General: Bowel sounds are normal.     Palpations: There is no hepatomegaly, splenomegaly or mass.     Tenderness: There is no abdominal tenderness. There is no guarding or rebound.  Musculoskeletal:        General: Normal range of motion.     Cervical back: Normal range of motion and neck supple.     Left knee: No effusion. Normal range of motion. Tenderness present over the medial joint line. No lateral joint line tenderness. No LCL laxity or MCL laxity.Normal patellar mobility.  Lymphadenopathy:     Cervical: No cervical adenopathy.  Skin:    General: Skin is warm.  Neurological:     Mental Status: He is alert.     Deep Tendon Reflexes: Reflexes are normal and symmetric.    Wt Readings from Last 3 Encounters:  02/16/22 225 lb (102.1 kg)  06/20/21 238 lb (108 kg)  04/12/21 223 lb (101.2 kg)    BP 102/60   Pulse (!) 58   Ht '5\' 11"'  (1.803 m)   Wt 225 lb (102.1 kg)   BMI 31.38 kg/m   Assessment and Plan:  1. Chronic pain of left knee Chronic.  But new exacerbation.  Patient does do weightlifting which I have cautioned loading beyond the knee when doing squats.  There is mild tenderness along the medial joint line.  Patient has had a history of meniscal repair by Dr.  Francia Greaves in the past.  This  may reflect some further deterioration of the the menisci.  We will obtain an x-ray to see if there is any decrease in the articular space.  We will initiate meloxicam 15 mg once a day.  We will place referral for Dr. Roland Rack for evaluation.  Although surgery is an option the patient is primarily interested in other measures prior and particularly whether or not gel injections or other modalities may be be helpful in maintaining left knee function - DG Knee Complete 4 Views Left - meloxicam (MOBIC) 15 MG tablet; Take 1 tablet (15 mg total) by mouth daily.  Dispense: 30 tablet; Refill: 3

## 2022-02-19 ENCOUNTER — Ambulatory Visit: Payer: 59 | Admitting: Family Medicine

## 2022-02-19 ENCOUNTER — Encounter: Payer: Self-pay | Admitting: Family Medicine

## 2022-02-19 VITALS — BP 140/90 | HR 78 | Ht 71.0 in | Wt 225.0 lb

## 2022-02-19 DIAGNOSIS — M1712 Unilateral primary osteoarthritis, left knee: Secondary | ICD-10-CM

## 2022-02-19 DIAGNOSIS — M2392 Unspecified internal derangement of left knee: Secondary | ICD-10-CM | POA: Diagnosis not present

## 2022-02-19 MED ORDER — DICLOFENAC SODIUM 75 MG PO TBEC: 75.0000 mg | DELAYED_RELEASE_TABLET | Freq: Two times a day (BID) | ORAL | 0 refills | Status: DC | PRN

## 2022-02-19 NOTE — Progress Notes (Signed)
Primary Care / Sports Medicine Office Visit  Patient Information:  Patient ID: David Graham, male DOB: 05/10/1986 Age: 36 y.o. MRN: [id number]   David Graham is a pleasant 36 y.o. male presenting with the following:  Chief Complaint  Patient presents with   Knee Pain    Left knee pain, had surgery years on left knee meniscus scope, started acting up about 1 month ago, states meloxicam is helping. States it feels about the same as it did then.     Vitals:   02/19/22 1304  BP: 140/90  Pulse: 78   Vitals:   02/19/22 1304  Weight: 225 lb (102.1 kg)  Height: 5\' 11"  (1.803 m)   Body mass index is 31.38 kg/m.  DG Knee Complete 4 Views Left  Result Date: 02/17/2022 CLINICAL DATA:  L) knee pain and instability EXAM: LEFT KNEE - COMPLETE 4+ VIEW COMPARISON:  None Available. FINDINGS: No acute fracture or dislocation. Minimal tricompartmental degenerative changes. No area of erosion or osseous destruction. No unexpected radiopaque foreign body. Soft tissues are unremarkable. IMPRESSION: No acute fracture or dislocation. Electronically Signed   By: 04/19/2022 M.D.   On: 02/17/2022 19:01     Independent interpretation of notes and tests performed by another provider:   Independent interpretation left knee x-rays dated 02/16/2022 reveal tricompartmental osteoarthritis, elements of dynamic maltracking and focality of degenerative changes primarily about the patellofemoral and lateral tibiofemoral compartments.  Procedures performed:   None  Pertinent History, Exam, Impression, and Recommendations:   Problem List Items Addressed This Visit       Musculoskeletal and Integument   Internal derangement of left knee - Primary    Patient presents for evaluation of acute on chronic left knee pain in the setting of prior meniscal derangement status post arthroscopic surgery roughly 10 years prior.  He has frequent (roughly monthly) episodes of left knee pain, instability, denies any  mechanical symptoms of catching/locking or buckling.  Pain has interfered with his daily activities and athletics.  Most recent episode 3 weeks prior while rising from a squat position in the gym, denies any swelling, had lateral and medial pain without radiation.  He did touch base with his PCP Dr. 04/18/2022 who wrote for meloxicam, this is provided roughly 60% improvement in his symptoms, additionally he is utilizing a sleeve brace.  Examination reveals no effusion, full range of motion that is painless, tenderness about the medial greater than lateral joint lines, medial patellar facet, no anterior/posterior laxity, negative varus/valgus stressing, negative Lachman's, equivocal McMurray's localizing medially.  We discussed his history, findings today including x-rays, and concern for symptoms primarily stemming from the degenerative changes noted, cannot exclude recurrent meniscal derangement.  He is not amenable to surgical treatment, as such we will reach out for viscosupplementation authorization, transition meloxicam to diclofenac, and place referral for physical therapy to oversee primarily home-based program.        Relevant Medications   diclofenac (VOLTAREN) 75 MG EC tablet   Other Relevant Orders   Ambulatory referral to Physical Therapy   Primary osteoarthritis of left knee    See additional assessment(s) for plan details.       Relevant Medications   diclofenac (VOLTAREN) 75 MG EC tablet   Other Relevant Orders   Ambulatory referral to Physical Therapy     Orders & Medications Meds ordered this encounter  Medications   diclofenac (VOLTAREN) 75 MG EC tablet    Sig: Take 1 tablet (75  mg total) by mouth 2 (two) times daily as needed.    Dispense:  60 tablet    Refill:  0   Orders Placed This Encounter  Procedures   Ambulatory referral to Physical Therapy     Return if symptoms worsen or fail to improve.     [name], MD   Primary Care Sports  Medicine [facility]

## 2022-02-19 NOTE — Assessment & Plan Note (Signed)
Patient presents for evaluation of acute on chronic left knee pain in the setting of prior meniscal derangement status post arthroscopic surgery roughly 10 years prior.  He has frequent (roughly monthly) episodes of left knee pain, instability, denies any mechanical symptoms of catching/locking or buckling.  Pain has interfered with his daily activities and athletics.  Most recent episode 3 weeks prior while rising from a squat position in the gym, denies any swelling, had lateral and medial pain without radiation.  He did touch base with his PCP Dr. Elizabeth Sauer who wrote for meloxicam, this is provided roughly 60% improvement in his symptoms, additionally he is utilizing a sleeve brace.  Examination reveals no effusion, full range of motion that is painless, tenderness about the medial greater than lateral joint lines, medial patellar facet, no anterior/posterior laxity, negative varus/valgus stressing, negative Lachman's, equivocal McMurray's localizing medially.  We discussed his history, findings today including x-rays, and concern for symptoms primarily stemming from the degenerative changes noted, cannot exclude recurrent meniscal derangement.  He is not amenable to surgical treatment, as such we will reach out for viscosupplementation authorization, transition meloxicam to diclofenac, and place referral for physical therapy to oversee primarily home-based program.

## 2022-02-19 NOTE — Assessment & Plan Note (Signed)
See additional assessment(s) for plan details. 

## 2022-02-19 NOTE — Patient Instructions (Signed)
-   Transition to diclofenac every a.m. and every p.m. scheduled x1 week (take with food) - After 1 week switch to as needed dosing of diclofenac up to twice a day to control symptoms as you perform physical therapy and home exercises - Use knee sleeve while at work, gym, and as needed for knee pain - Our office will contact you once we get word from insurance regarding viscosupplementation (gel injection) coverage - Contact us for any questions

## 2022-02-22 ENCOUNTER — Ambulatory Visit: Payer: 59 | Admitting: Psychiatry

## 2022-02-26 ENCOUNTER — Telehealth: Payer: Self-pay | Admitting: Family Medicine

## 2022-02-26 ENCOUNTER — Telehealth: Payer: Self-pay

## 2022-02-26 NOTE — Telephone Encounter (Signed)
Called pt- told him it takes a couple of weeks to hear back from insurance on the gel injections. I asked that he give [place] a call in 2 weeks if he still hasn't heard something. Otherwise, Steffanie Dunn has insurance working on it.

## 2022-02-26 NOTE — Telephone Encounter (Signed)
Copied from CRM [id number]. Topic: General - Inquiry >> Feb 26, 2022  2:13 PM Payton Doughty wrote: Reason for CRM: pt wants to know if Dr Ashley Royalty has gotten his knee gel injections approved through his insurance.

## 2022-03-18 ENCOUNTER — Other Ambulatory Visit: Payer: Self-pay | Admitting: Family Medicine

## 2022-03-18 DIAGNOSIS — M1712 Unilateral primary osteoarthritis, left knee: Secondary | ICD-10-CM

## 2022-03-18 DIAGNOSIS — M2392 Unspecified internal derangement of left knee: Secondary | ICD-10-CM

## 2022-03-19 NOTE — Telephone Encounter (Signed)
Requested medications are due for refill today.  yes  Requested medications are on the active medications list.  yes  Last refill. 02/19/2022 #60 0 refills  Future visit scheduled.   no  Notes to clinic.  Failed protocol - abnormal labs.    Requested Prescriptions  Pending Prescriptions Disp Refills   diclofenac (VOLTAREN) 75 MG EC tablet [Pharmacy Med Name: DICLOFENAC SOD EC 75 MG TAB] 60 tablet 0    Sig: TAKE 1 TABLET BY MOUTH 2 TIMES DAILY AS NEEDED.     Analgesics:  NSAIDS Failed - 03/18/2022  9:27 AM      Failed - Manual Review: Labs are only required if the patient has taken medication for more than 8 weeks.      Failed - PLT in normal range and within 360 days    Platelets  Date Value Ref Range Status  10/31/2021 140 (L) 150 - 400 K/uL Final  04/02/2019 136 (L) 150 - 450 x10E3/uL Final         Passed - Cr in normal range and within 360 days    Creatinine  Date Value Ref Range Status  09/25/2013 1.10 0.60 - 1.30 mg/dL Final   Creatinine, Ser  Date Value Ref Range Status  10/31/2021 1.06 0.61 - 1.24 mg/dL Final         Passed - HGB in normal range and within 360 days    Hemoglobin  Date Value Ref Range Status  10/31/2021 16.0 13.0 - 17.0 g/dL Final  04/02/2019 15.2 13.0 - 17.7 g/dL Final         Passed - HCT in normal range and within 360 days    HCT  Date Value Ref Range Status  10/31/2021 43.3 39.0 - 52.0 % Final   Hematocrit  Date Value Ref Range Status  04/02/2019 43.2 37.5 - 51.0 % Final         Passed - eGFR is 30 or above and within 360 days    EGFR (African American)  Date Value Ref Range Status  09/25/2013 >60  Final   GFR calc Af Amer  Date Value Ref Range Status  04/02/2019 108 >59 mL/min/1.73 Final   EGFR (Non-African Amer.)  Date Value Ref Range Status  09/25/2013 >60  Final    Comment:    eGFR values <60m/min/1.73 m2 may be an indication of chronic kidney disease (CKD). Calculated eGFR is useful in patients with stable renal  function. The eGFR calculation will not be reliable in acutely ill patients when serum creatinine is changing rapidly. It is not useful in  patients on dialysis. The eGFR calculation may not be applicable to patients at the low and high extremes of body sizes, pregnant women, and vegetarians.    GFR, Estimated  Date Value Ref Range Status  10/31/2021 >60 >60 mL/min Final    Comment:    (NOTE) Calculated using the CKD-EPI Creatinine Equation (2021)    eGFR  Date Value Ref Range Status  04/12/2021 88 >59 mL/min/1.73 Final         Passed - Patient is not pregnant      Passed - Valid encounter within last 12 months    Recent Outpatient Visits           4 weeks ago [facility]   1 month ago [facility] pain of [facility]   11 months ago  Annual physical exam   [facility], Deanna C, MD   2 years ago Intermittent palpitations   [facility], Deanna C, MD   2 years ago Transient elevated blood pressure   [facility], Deanna C, MD                

## 2022-03-21 NOTE — Telephone Encounter (Signed)
Waiting on answer.

## 2022-03-21 NOTE — Telephone Encounter (Signed)
Pt is calling back to follow up on the gel injections.  Pt stated he has not heard back form [place] or from the insurance.   Please advise.

## 2022-03-23 NOTE — Telephone Encounter (Signed)
PC to pt, LVM for pt to return call, pt got approved but will have to pay 40% of injection which is $1080-David Graham

## 2022-03-24 ENCOUNTER — Other Ambulatory Visit: Payer: Self-pay | Admitting: Psychiatry

## 2022-03-26 ENCOUNTER — Telehealth: Payer: Self-pay | Admitting: Family Medicine

## 2022-03-26 NOTE — Telephone Encounter (Signed)
Spoke with pt, pt will decide and let us know if he wants [place] to order it.

## 2022-03-26 NOTE — Telephone Encounter (Signed)
Pt note printed with xray report left at front desk to pick up

## 2022-03-26 NOTE — Telephone Encounter (Signed)
Pt says that he was seeing Dr. Ashley Royalty until he became out of network. Pt says that he found another ortho provider and would like to know if Dr. Ashley Royalty would send over his medical records to the other provider?    Dr. Teryl Lucy - [address].   Phone: [phone]   Pt doesn't have a fax number.   Phone: [phone]

## 2022-03-26 NOTE — Telephone Encounter (Signed)
Pt needs a fu as adamant they he must have the actual x-Rays, called office, nurse with pt, FU at [phone]

## 2022-03-26 NOTE — Telephone Encounter (Signed)
Informed Pt that we would have to go to [facility] to obtain this. Pt voiced understanding.

## 2022-04-17 ENCOUNTER — Other Ambulatory Visit: Payer: Self-pay | Admitting: Family Medicine

## 2022-04-17 DIAGNOSIS — M1712 Unilateral primary osteoarthritis, left knee: Secondary | ICD-10-CM

## 2022-04-17 DIAGNOSIS — M2392 Unspecified internal derangement of left knee: Secondary | ICD-10-CM

## 2022-04-18 NOTE — Telephone Encounter (Signed)
Requested Prescriptions  Pending Prescriptions Disp Refills  . diclofenac (VOLTAREN) 75 MG EC tablet [Pharmacy Med Name: DICLOFENAC SOD EC 75 MG TAB] 180 tablet 0    Sig: TAKE 1 TABLET BY MOUTH TWICE A DAY AS NEEDED     Analgesics:  NSAIDS Failed - 04/17/2022  2:41 AM      Failed - Manual Review: Labs are only required if the patient has taken medication for more than 8 weeks.      Failed - PLT in normal range and within 360 days    Platelets  Date Value Ref Range Status  10/31/2021 140 (L) 150 - 400 K/uL Final  04/02/2019 136 (L) 150 - 450 x10E3/uL Final         Passed - Cr in normal range and within 360 days    Creatinine  Date Value Ref Range Status  09/25/2013 1.10 0.60 - 1.30 mg/dL Final   Creatinine, Ser  Date Value Ref Range Status  10/31/2021 1.06 0.61 - 1.24 mg/dL Final         Passed - HGB in normal range and within 360 days    Hemoglobin  Date Value Ref Range Status  10/31/2021 16.0 13.0 - 17.0 g/dL Final  04/02/2019 15.2 13.0 - 17.7 g/dL Final         Passed - HCT in normal range and within 360 days    HCT  Date Value Ref Range Status  10/31/2021 43.3 39.0 - 52.0 % Final   Hematocrit  Date Value Ref Range Status  04/02/2019 43.2 37.5 - 51.0 % Final         Passed - eGFR is 30 or above and within 360 days    EGFR (African American)  Date Value Ref Range Status  09/25/2013 >60  Final   GFR calc Af Amer  Date Value Ref Range Status  04/02/2019 108 >59 mL/min/1.73 Final   EGFR (Non-African Amer.)  Date Value Ref Range Status  09/25/2013 >60  Final    Comment:    eGFR values <67m/min/1.73 m2 may be an indication of chronic kidney disease (CKD). Calculated eGFR is useful in patients with stable renal function. The eGFR calculation will not be reliable in acutely ill patients when serum creatinine is changing rapidly. It is not useful in  patients on dialysis. The eGFR calculation may not be applicable to patients at the low and high extremes of body  sizes, pregnant women, and vegetarians.    GFR, Estimated  Date Value Ref Range Status  10/31/2021 >60 >60 mL/min Final    Comment:    (NOTE) Calculated using the CKD-EPI Creatinine Equation (2021)    eGFR  Date Value Ref Range Status  04/12/2021 88 >59 mL/min/1.73 Final         Passed - Patient is not pregnant      Passed - Valid encounter within last 12 months    Recent Outpatient Visits          1 month ago [facility]ang[facility] left [facility]   2 months [facility]   1 year ago [facility]   2 years ago [facility]termittent palpitations   [facility]   2 years ago Transient elevated blood pressure   [facility]

## 2022-06-06 ENCOUNTER — Telehealth: Payer: Self-pay | Admitting: Family Medicine

## 2022-06-06 NOTE — Telephone Encounter (Signed)
Called pt informed him that office notes are ready for pick up. Pt verbalized understanding.  KP

## 2022-06-06 NOTE — Telephone Encounter (Signed)
Copied from [place] [id number]. Topic: General - Other >> Jun 06, 2022  9:29 AM Cyndi Bender wrote: Reason for CRM: Pt requests a copy of Dr. Zigmund Daniel' visit notes. Pt asked to be contacted once the notes are ready for pick up. Cb# [phone]

## 2022-06-06 NOTE — Telephone Encounter (Signed)
Noted  KP 

## 2022-06-06 NOTE — Telephone Encounter (Signed)
Pt called to report that his father Mccauley Diehl will come in and retrieve documents.

## 2022-06-23 DIAGNOSIS — Z9889 Other specified postprocedural states: Secondary | ICD-10-CM | POA: Insufficient documentation

## 2022-06-26 ENCOUNTER — Ambulatory Visit: Payer: 59 | Admitting: Urology

## 2022-06-26 ENCOUNTER — Encounter: Payer: Self-pay | Admitting: Urology

## 2022-06-26 VITALS — BP 133/81 | HR 73 | Ht 71.0 in | Wt 235.0 lb

## 2022-06-26 DIAGNOSIS — N522 Drug-induced erectile dysfunction: Secondary | ICD-10-CM

## 2022-06-26 MED ORDER — TADALAFIL 5 MG PO TABS: 5.0000 mg | ORAL_TABLET | Freq: Every day | ORAL | 3 refills | Status: DC | PRN

## 2022-06-26 NOTE — Progress Notes (Signed)
   06/26/2022 11:03 AM   David Graham [birth year] [id number]  Reason for visit: Follow up delayed orgasm/ejaculation  HPI: 36 year old male on Remeron and Klonopin for anxiety he was had problems with delayed ejaculations on those medications.  Testosterone was normal.  He has tried other medications but those were not as helpful for his anxiety.  He has had good results on Cialis 5 mg as needed before sexual activity, and would like to continue this medication.  Cialis 5 mg as needed refilled He prefers to continue yearly urology follow-up  [place], [place] [address] [place], [place] [address] [phone]

## 2022-07-26 ENCOUNTER — Ambulatory Visit: Payer: 59 | Admitting: Family Medicine

## 2022-07-26 ENCOUNTER — Encounter: Payer: Self-pay | Admitting: Family Medicine

## 2022-07-26 VITALS — BP 132/100 | HR 71 | Ht 71.0 in | Wt 236.0 lb

## 2022-07-26 DIAGNOSIS — I10 Essential (primary) hypertension: Secondary | ICD-10-CM | POA: Diagnosis not present

## 2022-07-26 DIAGNOSIS — R0789 Other chest pain: Secondary | ICD-10-CM

## 2022-07-26 DIAGNOSIS — R001 Bradycardia, unspecified: Secondary | ICD-10-CM | POA: Diagnosis not present

## 2022-07-26 DIAGNOSIS — R519 Headache, unspecified: Secondary | ICD-10-CM

## 2022-07-26 MED ORDER — VALSARTAN 80 MG PO TABS: 80.0000 mg | ORAL_TABLET | Freq: Every day | ORAL | 1 refills | Status: DC

## 2022-07-26 NOTE — Progress Notes (Signed)
Date:  07/26/2022   Name:  David Graham   DOB:  1986/07/12   MRN:  [id number]   Chief Complaint: No chief complaint on file.  Chest Pain  This is a chronic problem. The current episode started more than 1 month ago. The onset quality is gradual. The problem has been waxing and waning. The pain is present in the substernal region. The quality of the pain is described as dull. The pain does not radiate. Associated symptoms include diaphoresis, headaches and palpitations. Pertinent negatives include no abdominal pain, dizziness, irregular heartbeat, near-syncope, orthopnea or shortness of breath.  His past medical history is significant for hypertension.  Headache  This is a new ("feel weird") problem. The current episode started in the past 7 days. The problem occurs intermittently. Pertinent negatives include no abdominal pain, blurred vision or dizziness. His past medical history is significant for hypertension.  Hypertension This is a new problem. The current episode started 1 to 4 weeks ago. The problem is uncontrolled. Associated symptoms include chest pain, headaches and palpitations. Pertinent negatives include no blurred vision, orthopnea, peripheral edema or shortness of breath. Associated agents: supplements. Past treatments include nothing.    Lab Results  Component Value Date   NA 134 (L) 10/31/2021   K 4.5 10/31/2021   CO2 26 10/31/2021   GLUCOSE 114 (H) 10/31/2021   BUN 14 10/31/2021   CREATININE 1.06 10/31/2021   CALCIUM 9.8 10/31/2021   EGFR 88 04/12/2021   GFRNONAA >60 10/31/2021   Lab Results  Component Value Date   CHOL 250 (H) 04/12/2021   HDL 59 04/12/2021   LDLCALC 168 (H) 04/12/2021   TRIG 130 04/12/2021   Lab Results  Component Value Date   TSH 2.906 10/31/2021   No results found for: "HGBA1C" Lab Results  Component Value Date   WBC 3.5 (L) 10/31/2021   HGB 16.0 10/31/2021   HCT 43.3 10/31/2021   MCV 91.0 10/31/2021   PLT 140 (L) 10/31/2021    Lab Results  Component Value Date   ALT 35 10/31/2021   AST 27 10/31/2021   ALKPHOS 62 10/31/2021   BILITOT 1.0 10/31/2021   No results found for: "25OHVITD2", "25OHVITD3", "VD25OH"   Review of Systems  Constitutional:  Positive for diaphoresis.  Eyes:  Negative for blurred vision.  Respiratory:  Positive for chest tightness. Negative for shortness of breath and wheezing.   Cardiovascular:  Positive for chest pain and palpitations. Negative for orthopnea, leg swelling and near-syncope.  Gastrointestinal:  Negative for abdominal pain.  Neurological:  Positive for headaches. Negative for dizziness.    Patient Active Problem List   Diagnosis Date Noted   Internal derangement of left knee 02/19/2022   Primary osteoarthritis of left knee 02/19/2022   Panic disorder 08/03/2018   Episodic mood disorder ([place]) 08/03/2018    Allergies  Allergen Reactions   Imipramine Hives   Oxcarbazepine    Viibryd [Vilazodone Hcl]     Past Surgical History:  Procedure Laterality Date   left knee arthoscopy Left    ~2013 timeframe    Social History   Tobacco Use   Smoking status: Never    Passive exposure: Never   Smokeless tobacco: Former    Quit date: 10/31/2020   Tobacco comments:    occassional  Vaping Use   Vaping Use: Never used  Substance Use Topics   Alcohol use: Yes    Alcohol/week: 0.0 standard drinks of alcohol   Drug use: No  Medication list has been reviewed and updated.  Current Meds  Medication Sig   clonazePAM (KLONOPIN) 0.5 MG tablet Take 1 tablet (0.5 mg total) by mouth 3 (three) times daily as needed for anxiety.   methylphenidate (RITALIN) 10 MG tablet Take 10 mg by mouth 2 (two) times daily.   mirtazapine (REMERON) 15 MG tablet TAKE 1 TABLET BY MOUTH EVERYDAY AT BEDTIME   tadalafil (CIALIS) 5 MG tablet Take 1 tablet (5 mg total) by mouth daily as needed for erectile dysfunction.       07/26/2022    4:22 PM 02/19/2022    1:12 PM 02/16/2022    8:01 AM  04/12/2021    9:54 AM  GAD 7 : Generalized Anxiety Score  Nervous, Anxious, on Edge 0 1 0 0  Control/stop worrying 0 1 0 0  Worry too much - different things 0 1 0 0  Trouble relaxing 0 1 0 0  Restless 0 1 0 0  Easily annoyed or irritable 0 1 0 0  Afraid - awful might happen 0 0 0 0  Total GAD 7 Score 0 6 0 0  Anxiety Difficulty Not difficult at all Not difficult at all Not difficult at all        07/26/2022    4:21 PM 02/19/2022    1:11 PM 02/16/2022    8:01 AM  Depression screen PHQ 2/9  Decreased Interest 0 0 0  Down, Depressed, Hopeless 0 0 0  PHQ - 2 Score 0 0 0  Altered sleeping 0 1 0  Tired, decreased energy 0 1 0  Change in appetite 0 0 0  Feeling bad or failure about yourself  0 3 0  Trouble concentrating 0 0 0  Moving slowly or fidgety/restless 0 0 0  Suicidal thoughts 0 0 0  PHQ-9 Score 0 5 0  Difficult doing work/chores Not difficult at all Not difficult at all Not difficult at all    BP Readings from Last 3 Encounters:  07/26/22 (!) 132/100  06/26/22 133/81  02/19/22 140/90    Physical Exam Vitals and nursing note reviewed.  HENT:     Head: Normocephalic.     Right Ear: Tympanic membrane normal.     Left Ear: Tympanic membrane normal.     Mouth/Throat:     Mouth: Mucous membranes are moist.  Eyes:     Pupils: Pupils are equal, round, and reactive to light.  Cardiovascular:     Rate and Rhythm: Normal rate.     Pulses: Normal pulses.     Heart sounds: Normal heart sounds, S1 normal and S2 normal. No murmur heard.    No systolic murmur is present.     No diastolic murmur is present.     No friction rub. No gallop. No S3 or S4 sounds.  Pulmonary:     Breath sounds: No decreased breath sounds, wheezing or rhonchi.  Musculoskeletal:     Cervical back: Normal range of motion.     Wt Readings from Last 3 Encounters:  07/26/22 236 lb (107 kg)  06/26/22 235 lb (106.6 kg)  02/19/22 225 lb (102.1 kg)    BP (!) 132/100 (BP Location: Right Arm, Cuff  Size: Large)   Pulse 71   Ht _0  (1.803 m)   Wt 236 lb (107 kg)   SpO2 96%   BMI 32.92 kg/m   Assessment and Plan:  1. Primary hypertension Relatively new onset patient has had elevated readings at home and has  had elevated readings with specialties.  This predominantly involves the diastolic with some mild elevation of the systolic.  Patient has been taken exercise supplementals which may be contributing to this and is on Ritalin on an episodic basis per psychology.  In any event given the elevation of blood pressure and upcoming surgery we will begin with blood pressure lowering protocol initiating valsartan 80 mg once a day and refer to cardiology for follow-up for below circumstance. - Ambulatory referral to Cardiology - valsartan (DIOVAN) 80 MG tablet; Take 1 tablet (80 mg total) by mouth daily.  Dispense: 30 tablet; Refill: 1  2. Chest discomfort Patient has been having a vague chest discomfort with episodes of irregularity of the heartbeat.  And having some feelings of "weird "which may be presyncopal circumstances.  EKG was done with the following results.  Rate 61 there is a dropped beat.  Intervals normal.  No LVH by voltage criteria.  No ischemic changes such as Q waves, ST-T wave changes, nor delay in R wave progression.  Given upcoming surgery and irregularity of the heart rate we will refer to cardiology for possible monitoring or further evaluation such as echocardiogram. - EKG 12-Lead  3. Nonintractable episodic headache, unspecified headache type New onset of a headache that is having some dizziness.  This may be related to blood pressure or it may be anxiety provoked.  In any event we will lower blood pressure to see if results are positive.  4. Bradycardia Patient is having some issues that the blood pressure is lower and his rate sometimes goes down to 45 along with his current rate which is 61.  This may be due to some dropped apical beat resulting in a skipped beat.   EKG does not show any prolongation of PR interval nor PVCs.  Will refer to cardiology for further evaluation prior to upcoming surgery for meniscus repair. - Ambulatory referral to Cardiology    Otilio Miu, MD

## 2022-07-26 NOTE — Patient Instructions (Signed)

## 2022-07-30 ENCOUNTER — Ambulatory Visit: Payer: Self-pay | Admitting: *Deleted

## 2022-07-30 NOTE — Telephone Encounter (Signed)
Summary: BP concern / rx concern   The patient would like to speak with a member of clinical staff regarding their BP 150/80 taken around 7 AM today 07/30/22  The patient has experienced a mild headache, the patient has also noticed a recent decrease in their urination  The patient has recently been prescribed blood pressure medication and would like to speak with a member of clinical staff about their concerns further when possible  Please contact further when available         Chief Complaint: Medication Question Symptoms: BP 150/80 after taking Valsartan for 3 days. Pt questioning "Should it be lowering the top number quicker." Mild headache and "Urinating less frequently, pee little darker." Denies any associated urinary symptoms. States is staying hydrated, "Maybe I do need to drink more." Frequency: 3 days Pertinent Negatives: Patient denies any other urinary symptoms. Disposition: [] ED /[] Urgent Care (no appt availability in office) / [] Appointment(In office/virtual)/ []  [facility]/ [] Home Care/ [] Refused Recommended Disposition /[] [place] [facility]/ [x]  Follow-up with PCP Additional Notes: States most concerned with BP. States "I was told it would work quick."  Assured pt NT would route to practice for PCPs review. Care advise provided, verbalizes understanding. Please advise. Reason for Disposition  [1] Caller has NON-URGENT medicine question about med that PCP prescribed AND [2] triager unable to answer question  Answer Assessment - Initial Assessment Questions 1. NAME of MEDICINE: "What medicine(s) are you calling about?"     Valsartan 2. QUESTION: "What is your question?" (e.g., double dose of medicine, side effect)     "Should it be working quicker?" 3. PRESCRIBER: "Who prescribed the medicine?" Reason: if prescribed by specialist, call should be referred to that group.     PCP 4. SYMPTOMS: "Do you have any symptoms?" If Yes, ask: "What symptoms are you  having?"  "How bad are the symptoms (e.g., mild, moderate, severe)     Mild headache  Protocols used: Medication Question Call-A-AH

## 2022-08-17 ENCOUNTER — Other Ambulatory Visit: Payer: Self-pay | Admitting: Family Medicine

## 2022-08-17 DIAGNOSIS — I10 Essential (primary) hypertension: Secondary | ICD-10-CM

## 2022-08-17 NOTE — Telephone Encounter (Signed)
Requested medication (s) are due for refill today: no  Requested medication (s) are on the active medication list: yes  Last refill:  07/26/22 #30 1 RF  Future visit scheduled: no  Notes to clinic:  is cardiology managing this med?   Requested Prescriptions  Pending Prescriptions Disp Refills   valsartan (DIOVAN) 80 MG tablet [Pharmacy Med Name: VALSARTAN 80 MG TABLET] 90 tablet 1    Sig: TAKE 1 TABLET BY MOUTH EVERY DAY     Cardiovascular:  Angiotensin Receptor Blockers Failed - 08/17/2022  2:30 PM      Failed - Cr in normal range and within 180 days    Creatinine  Date Value Ref Range Status  09/25/2013 1.10 0.60 - 1.30 mg/dL Final   Creatinine, Ser  Date Value Ref Range Status  10/31/2021 1.06 0.61 - 1.24 mg/dL Final         Failed - K in normal range and within 180 days    Potassium  Date Value Ref Range Status  10/31/2021 4.5 3.5 - 5.1 mmol/L Final  09/25/2013 4.1 3.5 - 5.1 mmol/L Final         Failed - Last BP in normal range    BP Readings from Last 1 Encounters:  07/26/22 (!) 132/100         Passed - Patient is not pregnant      Passed - Valid encounter within last 6 months    Recent Outpatient Visits           3 weeks ago Primary hypertension   [facility] In[facility], MD   5 months ago Internal derangement of left knee   [facility], Ocie Bob, MD   6 months ago Chronic pain of left knee   [facility] Inches, MD   1 year ago Annual physical exam   [facility] Inches, MD   2 years ago Intermittent palpitations   [facility] Inches, MD       Future Appointments             In 10 months Richardo Hanks, Laurette Schimke, MD [facility] [place]

## 2022-09-06 DIAGNOSIS — M23201 Derangement of unspecified lateral meniscus due to old tear or injury, left knee: Secondary | ICD-10-CM | POA: Insufficient documentation

## 2022-10-07 ENCOUNTER — Encounter: Payer: Self-pay | Admitting: Family Medicine

## 2022-10-08 ENCOUNTER — Encounter: Payer: Self-pay | Admitting: Family Medicine

## 2022-10-08 ENCOUNTER — Ambulatory Visit: Payer: 59 | Admitting: Family Medicine

## 2022-10-08 VITALS — BP 128/82 | HR 68 | Ht 71.0 in | Wt 229.0 lb

## 2022-10-08 DIAGNOSIS — I10 Essential (primary) hypertension: Secondary | ICD-10-CM

## 2022-10-08 MED ORDER — VALSARTAN 160 MG PO TABS: 160.0000 mg | ORAL_TABLET | Freq: Every day | ORAL | 0 refills | Status: DC

## 2022-10-08 NOTE — Progress Notes (Signed)
Date:  10/08/2022   Name:  DELMAS FAUCETT   DOB:  Jun 24, 1986   MRN:  [id number]   Chief Complaint: Hypertension (Been taking valsartan bid instead of once a day. At home reading is 140s/90s)  Hypertension This is a chronic problem. The current episode started more than 1 month ago. The problem has been gradually improving since onset. The problem is controlled. Pertinent negatives include no blurred vision, chest pain, headaches, orthopnea, palpitations, PND or shortness of breath. There are no associated agents to hypertension. Past treatments include angiotensin blockers. The current treatment provides moderate improvement. There are no compliance problems.     Lab Results  Component Value Date   NA 134 (L) 10/31/2021   K 4.5 10/31/2021   CO2 26 10/31/2021   GLUCOSE 114 (H) 10/31/2021   BUN 14 10/31/2021   CREATININE 1.06 10/31/2021   CALCIUM 9.8 10/31/2021   EGFR 88 04/12/2021   GFRNONAA >60 10/31/2021   Lab Results  Component Value Date   CHOL 250 (H) 04/12/2021   HDL 59 04/12/2021   LDLCALC 168 (H) 04/12/2021   TRIG 130 04/12/2021   Lab Results  Component Value Date   TSH 2.906 10/31/2021   No results found for: "HGBA1C" Lab Results  Component Value Date   WBC 3.5 (L) 10/31/2021   HGB 16.0 10/31/2021   HCT 43.3 10/31/2021   MCV 91.0 10/31/2021   PLT 140 (L) 10/31/2021   Lab Results  Component Value Date   ALT 35 10/31/2021   AST 27 10/31/2021   ALKPHOS 62 10/31/2021   BILITOT 1.0 10/31/2021   No results found for: "25OHVITD2", "25OHVITD3", "VD25OH"   Review of Systems  Constitutional:  Negative for diaphoresis.  Eyes:  Negative for blurred vision.  Respiratory:  Negative for cough, choking, shortness of breath, wheezing and stridor.   Cardiovascular:  Negative for chest pain, palpitations, orthopnea, leg swelling and PND.  Gastrointestinal:  Negative for blood in stool.  Endocrine: Negative for polydipsia and polyuria.  Neurological:  Negative for  headaches.    Patient Active Problem List   Diagnosis Date Noted   Internal derangement of left knee 02/19/2022   Primary osteoarthritis of left knee 02/19/2022   Panic disorder 08/03/2018   Episodic mood disorder ([place]) 08/03/2018    Allergies  Allergen Reactions   Imipramine Hives   Oxcarbazepine    Viibryd [Vilazodone Hcl]     Past Surgical History:  Procedure Laterality Date   left knee arthoscopy Left    ~2013 timeframe    Social History   Tobacco Use   Smoking status: Never    Passive exposure: Never   Smokeless tobacco: Former    Quit date: 10/31/2020   Tobacco comments:    occassional  Vaping Use   Vaping Use: Never used  Substance Use Topics   Alcohol use: Yes    Alcohol/week: 0.0 standard drinks of alcohol   Drug use: No     Medication list has been reviewed and updated.  Current Meds  Medication Sig   clonazePAM (KLONOPIN) 0.5 MG tablet Take 1 tablet (0.5 mg total) by mouth 3 (three) times daily as needed for anxiety.   methylphenidate (RITALIN) 10 MG tablet Take 10 mg by mouth 2 (two) times daily.   mirtazapine (REMERON) 15 MG tablet TAKE 1 TABLET BY MOUTH EVERYDAY AT BEDTIME   tadalafil (CIALIS) 5 MG tablet Take 1 tablet (5 mg total) by mouth daily as needed for erectile dysfunction.   valsartan (  DIOVAN) 80 MG tablet TAKE 1 TABLET BY MOUTH EVERY DAY       10/08/2022   10:52 AM 07/26/2022    4:22 PM 02/19/2022    1:12 PM 02/16/2022    8:01 AM  GAD 7 : Generalized Anxiety Score  Nervous, Anxious, on Edge 0 0 1 0  Control/stop worrying 0 0 1 0  Worry too much - different things 0 0 1 0  Trouble relaxing 0 0 1 0  Restless 0 0 1 0  Easily annoyed or irritable 0 0 1 0  Afraid - awful might happen 0 0 0 0  Total GAD 7 Score 0 0 6 0  Anxiety Difficulty Not difficult at all Not difficult at all Not difficult at all Not difficult at all       10/08/2022   10:52 AM 07/26/2022    4:21 PM 02/19/2022    1:11 PM  Depression screen PHQ 2/9  Decreased  Interest 0 0 0  Down, Depressed, Hopeless 0 0 0  PHQ - 2 Score 0 0 0  Altered sleeping 0 0 1  Tired, decreased energy 0 0 1  Change in appetite 0 0 0  Feeling bad or failure about yourself  0 0 3  Trouble concentrating 0 0 0  Moving slowly or fidgety/restless 0 0 0  Suicidal thoughts 0 0 0  PHQ-9 Score 0 0 5  Difficult doing work/chores Not difficult at all Not difficult at all Not difficult at all    BP Readings from Last 3 Encounters:  10/08/22 128/82  07/26/22 (!) 132/100  06/26/22 133/81    Physical Exam Vitals and nursing note reviewed.  HENT:     Head: Normocephalic.     Right Ear: Tympanic membrane and external ear normal.     Left Ear: Tympanic membrane and external ear normal.     Nose: Nose normal. No congestion or rhinorrhea.     Mouth/Throat:     Mouth: Mucous membranes are moist.  Eyes:     General: No scleral icterus.       Right eye: No discharge.        Left eye: No discharge.     Conjunctiva/sclera: Conjunctivae normal.     Pupils: Pupils are equal, round, and reactive to light.  Neck:     Thyroid: No thyromegaly.     Vascular: No JVD.     Trachea: No tracheal deviation.  Cardiovascular:     Rate and Rhythm: Normal rate and regular rhythm.     Heart sounds: Normal heart sounds. No murmur heard.    No friction rub. No gallop.  Pulmonary:     Effort: No respiratory distress.     Breath sounds: Normal breath sounds. No wheezing, rhonchi or rales.  Abdominal:     General: Bowel sounds are normal.     Palpations: Abdomen is soft. There is no mass.     Tenderness: There is no abdominal tenderness. There is no guarding or rebound.  Musculoskeletal:        General: No tenderness. Normal range of motion.     Cervical back: Normal range of motion and neck supple.  Lymphadenopathy:     Cervical: No cervical adenopathy.  Skin:    General: Skin is warm.     Findings: No rash.  Neurological:     Deep Tendon Reflexes: Reflexes are normal and symmetric.      Wt Readings from Last 3 Encounters:  10/08/22 229 lb (103.9  kg)  07/26/22 236 lb (107 kg)  06/26/22 235 lb (106.6 kg)    BP 128/82   Pulse 68   Ht 5\' 11"  (1.803 m)   Wt 229 lb (103.9 kg)   SpO2 98%   BMI 31.94 kg/m   Assessment and Plan:  1. Primary hypertension Chronic.  Uncontrolled on 80 mg once a day.  Systolics were in the 937 range with diastolics in the high 90W.  Patient has recently had surgery however and may have some volume overload which could have contributed to this.  Patient started taking 80 mg twice a day and have an acceptable reading at 128/82.  Therefore we will increase to the 160 and patient may take 160 once a day or if he has a pill splitter he may break it in half and take 80 mg half a day as he is taking now whichever direction is fine and we will recheck patient in 6 weeks for blood pressure check.  In the meantime we have also given him low-cholesterol low triglyceride guidelines for preparation that we will going to go in that direction as well when we get him in the rehab mode with his knee. - valsartan (DIOVAN) 160 MG tablet; Take 1 tablet (160 mg total) by mouth daily.  Dispense: 90 tablet; Refill: 0    Otilio Miu, MD

## 2022-10-08 NOTE — Patient Instructions (Signed)
GUIDELINES FOR  LOW-CHOLESTEROL, LOW-TRIGLYCERIDE DIETS    FOODS TO USE   MEATS, FISH Choose lean meats (chicken, [place], veal, and non-fatty cuts of beef with excess fat trimmed; one serving = 3 oz of cooked meat). Also, fresh or frozen fish, canned fish packed in water, and shellfish (lobster, crabs, shrimp, and oysters). Limit use to no more than one serving of one of these per week. Shellfish are high in cholesterol but low in saturated fat and should be used sparingly. Meats and fish should be broiled (pan or oven) or baked on a rack.  EGGS Egg substitutes and egg whites (use freely). Egg yolks (limit two per week).  FRUITS Eat three servings of fresh fruit per day (1 serving =  cup). Be sure to have at least one citrus fruit daily. Frozen and canned fruit with no sugar or syrup added may be used.  VEGETABLES Most vegetables are not limited (see next page). One dark-green (string beans, escarole) or one deep yellow (squash) vegetable is recommended daily. Cauliflower, broccoli, and celery, as well as potato skins, are recommended for their fiber content. (Fiber is associated with cholesterol reduction) It is preferable to steam vegetables, but they may be boiled, strained, or braised with polyunsaturated vegetable oil (see below).  BEANS Dried peas or beans (1 serving =  cup) may be used as a bread substitute.  NUTS Almonds, walnuts, and peanuts may be used sparingly  (1 serving = 1 Tablespoonful). Use pumpkin, sesame, or sunflower seeds.  BREADS, GRAINS One roll or one slice of whole grain or enriched bread may be used, or three soda crackers or four pieces of melba toast as a substitute. Spaghetti, rice or noodles ( cup) or  large ear of corn may be used as a bread substitute. In preparing these foods do not use butter or shortening, use soft margarine. Also use egg and sugar substitutes.  Choose high fiber grains, such as oats and whole wheat.  CEREALS Use  cup of hot cereal or  cup of  cold cereal per day. Add a sugar substitute if desired, with 99% fat free or skim milk.  MILK PRODUCTS Always use 99% fat free or skim milk, dairy products such as low fat cheeses (farmer's uncreamed diet cottage), low-fat yogurt, and powdered skim milk.  FATS, OILS Use soft (not stick) margarine; vegetable oils that are high in polyunsaturated fats (such as safflower, sunflower, soybean, corn, and cottonseed). Always refrigerate meat drippings to harden the fat and remove it before preparing gravies  DESSERTS, SNACKS Limit to two servings per day; substitute each serving for a bread/cereal serving: ice milk, water sherbet (1/4 cup); unflavored gelatin or gelatin flavored with sugar substitute (1/3 cup); pudding prepared with skim milk (1/2 cup); egg white souffls; unbuttered popcorn (1  cups). Substitute carob for chocolate.  BEVERAGES Fresh fruit juices (limit 4 oz per day); black coffee, plain or herbal teas; soft drinks with sugar substitutes; club soda, preferably salt-free; cocoa made with skim milk or nonfat dried milk and water (sugar substitute added if desired); clear broth. Alcohol: limit two servings per day (see second page).  MISCELLANEOUS  You may use the following freely: vinegar, spices, herbs, nonfat bouillon, mustard, Worcestershire sauce, soy sauce, flavoring essence.                  GUIDELINES FOR  LOW-CHOLESTEROL, LOW TRIGLYCERIDE DIETS    FOODS TO AVOID   MEATS, FISH Marbled beef, pork, bacon, sausage, and other pork products; fatty  fowl (duck, goose); skin and fat of turkey and chicken; processed meats; luncheon meats (salami, bologna); frankfurters and fast-food hamburgers (theyre loaded with fat); organ meats (kidneys, liver); canned fish packed in oil.  °EGGS Limit egg yolks to two per week.   °FRUITS Coconuts (rich in saturated fats).  °VEGETABLES Avoid avocados. Starchy vegetables (potatoes, corn, lima beans, dried peas, beans) may be used only if  substitutes for a serving of bread or cereal. (Baked potato skin, however, is desirable for its fiber content.  °BEANS Commercial baked beans with sugar and/or pork added.  °NUTS Avoid nuts.  Limit peanuts and walnuts to one tablespoonful per day.  °BREADS, GRAINS Any baked goods with shortening and/or sugar. Commercial mixes with dried eggs and whole milk. Avoid sweet rolls, doughnuts, breakfast pastries (Danish), and sweetened packaged cereals (the added sugar converts readily to triglycerides).  °MILK PRODUCTS Whole milk and whole-milk packaged goods; cream; ice cream; whole-milk puddings, yogurt, or cheeses; nondairy cream substitutes.  °FATS, OILS Butter, lard, animal fats, bacon drippings, gravies, cream sauces as well as palm and coconut oils. All these are high in saturated fats. Examine labels on cholesterol free products for hydrogenated fats. (These are oils that have been hardened into solids and in the process have become saturated.)  °DESSERTS, SNACKS Fried snack foods like potato chips; chocolate; candies in general; jams, jellies, syrups; whole- milk puddings; ice cream and milk sherbets; hydrogenated peanut butter.  °BEVERAGES Sugared fruit juices and soft drinks; cocoa made with whole milk and/or sugar. When using alcohol (1 oz liquor, 5 oz beer, or 2 ½ oz dry table wine per serving), one serving must be substituted for one bread or cereal serving (limit, two servings of alcohol per day).  ° SPECIAL NOTES  °  Remember that even non-limited foods should be used in moderation. °While on a cholesterol-lowering diet, be sure to avoid animal fats and marbled meats. °3. While on a triglyceride-lowering diet, be sure to avoid sweets and to control the amount of carbohydrates you eat (starchy foods such as flour, bread, potatoes).While on a tri-glyceride-lowering diet, be sure to avoid sweets °Buy a good low-fat cookbook, such as the one published by the American Heart Association. °Consult your physician  if you have any questions.  ° ° ° ° ° ° ° ° ° ° ° ° ° °[facility] Low Glycemic Diet Plan ° ° °Low Glycemic Foods (20-49) Moderate Glycemic Foods (50-69) High Glycemic Foods (70-100)  °    °Breakfast Creals Breakfast Cereals Breakfast Cereals  °All Bran All-Bran Fruit'n Oats  ° Bran Buds Bran Chex  ° Cheerios Corn chex  °  °Fiber One Oatmeal (not instant)  ° Just Right Mini-Wheats  ° Corn Flakes Cream of Wheat  °  °Oat Bran Special K Swiss Muesli  ° Grape Nuts Grape Nut Flakes  °  °  Grits Nutri-Grain  °  °Fruits and fruit juice: Fruits Puffed Rice Puffed Wheat  °  °(Limit to 1-2 Servings per day) Banana (under-ride) Dates  ° Rice Chex Rice Krispies  °  °Apples Apricots (fresh/dried)  ° Figs Grapes  ° Shredded Wheat Team  °  °Blackberries Blueberries  ° Kiwi Mango  ° Total   °  °Cherries Cranberries  ° Oranges Raisins  °   °Peaches Pears  °  Fruits  °Plums Prunes  ° Fruit Juices Pineapple Watermelon  °  °Grapefruit Raspberries  ° Cranberry Juice Orange Juice  ° Banana (over-ripe)   °  °Strawberries Tangerines  °    °  Apple Juice Grapefruit Juice  ° Beans and Legumes Beverages  °Tomato Juice   ° [place]-type baked beans Sodas, sweet tea, pineapple juice  ° Canned pinto, kidney, or navy beans   °Beans and Legumes (fresh-cooked) Green peas Vegetables  °Black-eyed peas Butter Beans  °  Potato, baked, boiled, fried, mashed  °Chick peas Lentils  ° Vegetables French fries  °Green beans Lima beans  ° Beets Carrots  ° Canned or frozen corn  °Kidney beans Navy beans  ° Sweet potato Yam  ° Parsnips  °Pinto beans Snow peas  ° Corn on the cob Winter squash  °    °Non-starchy vegetables Grains Breads  °Asparagus, avocado, broccoli, cabbage Cornmeal Rice, brown  ° Most breads (white and whole grain)  °cauliflower, celery, cucumber, greens Rice, white Couscous  ° Bagels Bread sticks  °  °lettuce, mushrooms, peppers, tomatoes  Bread stuffing Kaiser roll  °  °okra, onions, spinach, summer squash Pasta Dinner rolls  ° Macaroni  Pizza, cheese  °   °Grains Ravioli, meat filled Spaghetti, white  ° Grains  °Barley Bulgur  °  Rice, instant Tapioca, with milk  °  °Rye Wild rice  ° Nuts   ° Cashews Macadamia  ° Candy and most cookies  °Nuts and oils    °Almonds, peanuts, sunflower seeds Snacks Snacks  °hazelnuts, pecans, walnuts Chocolate Ice cream, lowfat  ° Donuts Corn chips  °  °Oils that are liquid at room temperature Muffin Popcorn  ° Jelly beans Pretzels  °  °  Pastries  °Dairy, fish, meat, soy, and eggs    °Milk, skim Lowfat cheese  °  Restaurant and ethnic foods  °Yogurt, lowfat, fruit sugar sweetened  Most Chinese food (sugar in stir fry  °  or wok sauce)  °Lean red meat Fish  °  Teriyaki-style meats and vegetables  °Skinless chicken and turkey, shellfish    °    °Egg whites (up to 3 daily), Soy Products    °Egg yolks (up to 7 or _____ per week)    °  °

## 2022-10-18 ENCOUNTER — Telehealth: Payer: Self-pay | Admitting: Family Medicine

## 2022-11-20 ENCOUNTER — Ambulatory Visit: Payer: 59 | Admitting: Family Medicine

## 2022-11-20 ENCOUNTER — Encounter: Payer: Self-pay | Admitting: Family Medicine

## 2022-11-20 VITALS — BP 104/62 | HR 64 | Ht 71.0 in | Wt 230.0 lb

## 2022-11-20 DIAGNOSIS — I10 Essential (primary) hypertension: Secondary | ICD-10-CM

## 2022-11-20 DIAGNOSIS — E7801 Familial hypercholesterolemia: Secondary | ICD-10-CM | POA: Diagnosis not present

## 2022-11-20 MED ORDER — VALSARTAN 80 MG PO TABS: 80.0000 mg | ORAL_TABLET | Freq: Every day | ORAL | 1 refills | Status: DC

## 2022-11-20 NOTE — Progress Notes (Signed)
Date:  11/20/2022   Name:  David Graham   DOB:  [birth year]   MRN:  [id number]   Chief Complaint: Hypertension (Started valsartan in January- went back to the '80mg'$ )  Hypertension This is a chronic problem. The current episode started more than 1 year ago. The problem has been waxing and waning since onset. The problem is controlled. Pertinent negatives include no chest pain, headaches, neck pain, palpitations or shortness of breath. There are no associated agents to hypertension. Past treatments include angiotensin blockers. The current treatment provides moderate improvement. There are no compliance problems.  There is no history of angina, CAD/MI or CVA. There is no history of chronic renal disease, a hypertension causing med or renovascular disease.    Lab Results  Component Value Date   NA 134 (L) 10/31/2021   K 4.5 10/31/2021   CO2 26 10/31/2021   GLUCOSE 114 (H) 10/31/2021   BUN 14 10/31/2021   CREATININE 1.06 10/31/2021   CALCIUM 9.8 10/31/2021   EGFR 88 04/12/2021   GFRNONAA >60 10/31/2021   Lab Results  Component Value Date   CHOL 250 (H) 04/12/2021   HDL 59 04/12/2021   LDLCALC 168 (H) 04/12/2021   TRIG 130 04/12/2021   Lab Results  Component Value Date   TSH 2.906 10/31/2021   No results found for: "HGBA1C" Lab Results  Component Value Date   WBC 3.5 (L) 10/31/2021   HGB 16.0 10/31/2021   HCT 43.3 10/31/2021   MCV 91.0 10/31/2021   PLT 140 (L) 10/31/2021   Lab Results  Component Value Date   ALT 35 10/31/2021   AST 27 10/31/2021   ALKPHOS 62 10/31/2021   BILITOT 1.0 10/31/2021   No results found for: "25OHVITD2", "25OHVITD3", "VD25OH"   Review of Systems  Constitutional:  Negative for chills and fever.  HENT:  Negative for drooling, ear discharge, ear pain and sore throat.   Respiratory:  Negative for cough, shortness of breath and wheezing.   Cardiovascular:  Negative for chest pain, palpitations and leg swelling.  Gastrointestinal:  Negative for  abdominal pain, blood in stool, constipation, diarrhea and nausea.  Endocrine: Negative for polydipsia.  Genitourinary:  Negative for dysuria, frequency, hematuria and urgency.  Musculoskeletal:  Negative for back pain, myalgias and neck pain.  Skin:  Negative for rash.  Allergic/Immunologic: Negative for environmental allergies.  Neurological:  Negative for dizziness and headaches.  Hematological:  Does not bruise/bleed easily.  Psychiatric/Behavioral:  Negative for suicidal ideas. The patient is not nervous/anxious.     Patient Active Problem List   Diagnosis Date Noted   Internal derangement of left knee 02/19/2022   Primary osteoarthritis of left knee 02/19/2022   Panic disorder 08/03/2018   Episodic mood disorder ([place]) 08/03/2018    Allergies  Allergen Reactions   Imipramine Hives   Oxcarbazepine    Viibryd [Vilazodone Hcl]     Past Surgical History:  Procedure Laterality Date   left knee arthoscopy Left    ~2013 timeframe    Social History   Tobacco Use   Smoking status: Never    Passive exposure: Never   Smokeless tobacco: Former    Quit date: 10/31/2020   Tobacco comments:    occassional  Vaping Use   Vaping Use: Never used  Substance Use Topics   Alcohol use: Yes    Alcohol/week: 0.0 standard drinks of alcohol   Drug use: No     Medication list has been reviewed and updated.  Current  Meds  Medication Sig   clonazePAM (KLONOPIN) 0.5 MG tablet Take 1 tablet (0.5 mg total) by mouth 3 (three) times daily as needed for anxiety.   methylphenidate (RITALIN) 10 MG tablet Take 10 mg by mouth 2 (two) times daily.   mirtazapine (REMERON) 15 MG tablet TAKE 1 TABLET BY MOUTH EVERYDAY AT BEDTIME   tadalafil (CIALIS) 5 MG tablet Take 1 tablet (5 mg total) by mouth daily as needed for erectile dysfunction.   [DISCONTINUED] valsartan (DIOVAN) 160 MG tablet Take 1 tablet (160 mg total) by mouth daily.       11/20/2022    9:50 AM 10/08/2022   10:52 AM 07/26/2022     4:22 PM 02/19/2022    1:12 PM  GAD 7 : Generalized Anxiety Score  Nervous, Anxious, on Edge 0 0 0 1  Control/stop worrying 0 0 0 1  Worry too much - different things 0 0 0 1  Trouble relaxing 0 0 0 1  Restless 0 0 0 1  Easily annoyed or irritable 0 0 0 1  Afraid - awful might happen 0 0 0 0  Total GAD 7 Score 0 0 0 6  Anxiety Difficulty Not difficult at all Not difficult at all Not difficult at all Not difficult at all       11/20/2022    9:50 AM 10/08/2022   10:52 AM 07/26/2022    4:21 PM  Depression screen PHQ 2/9  Decreased Interest 0 0 0  Down, Depressed, Hopeless 0 0 0  PHQ - 2 Score 0 0 0  Altered sleeping 0 0 0  Tired, decreased energy 0 0 0  Change in appetite 0 0 0  Feeling bad or failure about yourself  0 0 0  Trouble concentrating 0 0 0  Moving slowly or fidgety/restless 0 0 0  Suicidal thoughts 0 0 0  PHQ-9 Score 0 0 0  Difficult doing work/chores Not difficult at all Not difficult at all Not difficult at all    BP Readings from Last 3 Encounters:  11/20/22 104/62  10/08/22 128/82  07/26/22 (!) 132/100    Physical Exam Vitals and nursing note reviewed.  HENT:     Head: Normocephalic.     Right Ear: External ear normal.     Left Ear: External ear normal.     Nose: Nose normal. No congestion or rhinorrhea.     Mouth/Throat:     Mouth: Mucous membranes are moist.  Eyes:     General: No scleral icterus.       Right eye: No discharge.        Left eye: No discharge.     Conjunctiva/sclera: Conjunctivae normal.     Pupils: Pupils are equal, round, and reactive to light.  Neck:     Thyroid: No thyromegaly.     Vascular: No JVD.     Trachea: No tracheal deviation.  Cardiovascular:     Rate and Rhythm: Normal rate and regular rhythm.     Heart sounds: Normal heart sounds. No murmur heard.    No friction rub. No gallop.  Pulmonary:     Effort: No respiratory distress.     Breath sounds: Normal breath sounds. No wheezing, rhonchi or rales.  Abdominal:      General: Bowel sounds are normal.     Palpations: Abdomen is soft. There is no mass.     Tenderness: There is no abdominal tenderness. There is no guarding or rebound.  Musculoskeletal:  General: No tenderness. Normal range of motion.     Cervical back: Normal range of motion and neck supple.  Lymphadenopathy:     Cervical: No cervical adenopathy.  Skin:    General: Skin is warm.     Findings: No rash.  Neurological:     Mental Status: He is alert and oriented to person, place, and time.     Cranial Nerves: No cranial nerve deficit.     Deep Tendon Reflexes: Reflexes are normal and symmetric.     Wt Readings from Last 3 Encounters:  11/20/22 230 lb (104.3 kg)  10/08/22 229 lb (103.9 kg)  07/26/22 236 lb (107 kg)    BP 104/62   Pulse 64   Ht '5\' 11"'$  (1.803 m)   Wt 230 lb (104.3 kg)   SpO2 95%   BMI 32.08 kg/m   Assessment and Plan:  1. Primary hypertension Chronic.  Controlled.  Stable.  Blood pressure is 104/62.  Asymptomatic.  Tolerating medications well.  Continue valsartan 80 mg once a day.  Will recheck in 6 months.  Will check CMP for electrolytes and GFR. - Comprehensive Metabolic Panel (CMET) - valsartan (DIOVAN) 80 MG tablet; Take 1 tablet (80 mg total) by mouth daily.  Dispense: 90 tablet; Refill: 1  2. Familial hypercholesterolemia Chronic.  Controlled.  Stable.  Elevated LDL in the past.  Will check lipid panel for current status of LDL in the meantime we have given patient low-cholesterol low triglyceride dietary guidelines. - Lipid Panel With LDL/HDL Ratio    Otilio Miu, MD

## 2022-11-20 NOTE — Patient Instructions (Signed)
GUIDELINES FOR  LOW-CHOLESTEROL, LOW-TRIGLYCERIDE DIETS    FOODS TO USE   MEATS, FISH Choose lean meats (chicken, turkey, veal, and non-fatty cuts of beef with excess fat trimmed; one serving = 3 oz of cooked meat). Also, fresh or frozen fish, canned fish packed in water, and shellfish (lobster, crabs, shrimp, and oysters). Limit use to no more than one serving of one of these per week. Shellfish are high in cholesterol but low in saturated fat and should be used sparingly. Meats and fish should be broiled (pan or oven) or baked on a rack.  EGGS Egg substitutes and egg whites (use freely). Egg yolks (limit two per week).  FRUITS Eat three servings of fresh fruit per day (1 serving =  cup). Be sure to have at least one citrus fruit daily. Frozen and canned fruit with no sugar or syrup added may be used.  VEGETABLES Most vegetables are not limited (see next page). One dark-green (string beans, escarole) or one deep yellow (squash) vegetable is recommended daily. Cauliflower, broccoli, and celery, as well as potato skins, are recommended for their fiber content. (Fiber is associated with cholesterol reduction) It is preferable to steam vegetables, but they may be boiled, strained, or braised with polyunsaturated vegetable oil (see below).  BEANS Dried peas or beans (1 serving =  cup) may be used as a bread substitute.  NUTS Almonds, walnuts, and peanuts may be used sparingly  (1 serving = 1 Tablespoonful). Use pumpkin, sesame, or sunflower seeds.  BREADS, GRAINS One roll or one slice of whole grain or enriched bread may be used, or three soda crackers or four pieces of melba toast as a substitute. Spaghetti, rice or noodles ( cup) or  large ear of corn may be used as a bread substitute. In preparing these foods do not use butter or shortening, use soft margarine. Also use egg and sugar substitutes.  Choose high fiber grains, such as oats and whole wheat.  CEREALS Use  cup of hot cereal or  cup of  cold cereal per day. Add a sugar substitute if desired, with 99% fat free or skim milk.  MILK PRODUCTS Always use 99% fat free or skim milk, dairy products such as low fat cheeses (farmer's uncreamed diet cottage), low-fat yogurt, and powdered skim milk.  FATS, OILS Use soft (not stick) margarine; vegetable oils that are high in polyunsaturated fats (such as safflower, sunflower, soybean, corn, and cottonseed). Always refrigerate meat drippings to harden the fat and remove it before preparing gravies  DESSERTS, SNACKS Limit to two servings per day; substitute each serving for a bread/cereal serving: ice milk, water sherbet (1/4 cup); unflavored gelatin or gelatin flavored with sugar substitute (1/3 cup); pudding prepared with skim milk (1/2 cup); egg white souffls; unbuttered popcorn (1  cups). Substitute carob for chocolate.  BEVERAGES Fresh fruit juices (limit 4 oz per day); black coffee, plain or herbal teas; soft drinks with sugar substitutes; club soda, preferably salt-free; cocoa made with skim milk or nonfat dried milk and water (sugar substitute added if desired); clear broth. Alcohol: limit two servings per day (see second page).  MISCELLANEOUS  You may use the following freely: vinegar, spices, herbs, nonfat bouillon, mustard, Worcestershire sauce, soy sauce, flavoring essence.                  GUIDELINES FOR  LOW-CHOLESTEROL, LOW TRIGLYCERIDE DIETS    FOODS TO AVOID   MEATS, FISH Marbled beef, pork, bacon, sausage, and other pork products; fatty   fowl (duck, goose); skin and fat of turkey and chicken; processed meats; luncheon meats (salami, bologna); frankfurters and fast-food hamburgers (theyre loaded with fat); organ meats (kidneys, liver); canned fish packed in oil.  EGGS Limit egg yolks to two per week.   FRUITS Coconuts (rich in saturated fats).  VEGETABLES Avoid avocados. Starchy vegetables (potatoes, corn, lima beans, dried peas, beans) may be used only if  substitutes for a serving of bread or cereal. (Baked potato skin, however, is desirable for its fiber content.  BEANS Commercial baked beans with sugar and/or pork added.  NUTS Avoid nuts.  Limit peanuts and walnuts to one tablespoonful per day.  BREADS, GRAINS Any baked goods with shortening and/or sugar. Commercial mixes with dried eggs and whole milk. Avoid sweet rolls, doughnuts, breakfast pastries (Danish), and sweetened packaged cereals (the added sugar converts readily to triglycerides).  MILK PRODUCTS Whole milk and whole-milk packaged goods; cream; ice cream; whole-milk puddings, yogurt, or cheeses; nondairy cream substitutes.  FATS, OILS Butter, lard, animal fats, bacon drippings, gravies, cream sauces as well as palm and coconut oils. All these are high in saturated fats. Examine labels on cholesterol free products for hydrogenated fats. (These are oils that have been hardened into solids and in the process have become saturated.)  DESSERTS, SNACKS Fried snack foods like potato chips; chocolate; candies in general; jams, jellies, syrups; whole- milk puddings; ice cream and milk sherbets; hydrogenated peanut butter.  BEVERAGES Sugared fruit juices and soft drinks; cocoa made with whole milk and/or sugar. When using alcohol (1 oz liquor, 5 oz beer, or 2  oz dry table wine per serving), one serving must be substituted for one bread or cereal serving (limit, two servings of alcohol per day).   SPECIAL NOTES    Remember that even non-limited foods should be used in moderation. While on a cholesterol-lowering diet, be sure to avoid animal fats and marbled meats. 3. While on a triglyceride-lowering diet, be sure to avoid sweets and to control the amount of carbohydrates you eat (starchy foods such as flour, bread, potatoes).While on a tri-glyceride-lowering diet, be sure to avoid sweets Buy a good low-fat cookbook, such as the one published by the American Heart Association. Consult your physician  if you have any questions.               [facility] Low Glycemic Diet Plan   Low Glycemic Foods (20-49) Moderate Glycemic Foods (50-69) High Glycemic Foods (70-100)      Breakfast Creals Breakfast Cereals Breakfast Cereals  All Bran All-Bran Fruit'n Oats   Bran Buds Bran Chex   Cheerios Corn chex    Fiber One Oatmeal (not instant)   Just Right Mini-Wheats   Corn Flakes Cream of Wheat    Oat Bran Special K Swiss Muesli   Grape Nuts Grape Nut Flakes      Grits Nutri-Grain    Fruits and fruit juice: Fruits Puffed Rice Puffed Wheat    (Limit to 1-2 Servings per day) Banana (under-ride) Dates   Rice Chex Rice Krispies    Apples Apricots (fresh/dried)   Figs Grapes   Shredded Wheat Team    Blackberries Blueberries   Kiwi Mango   Total     Cherries Cranberries   Oranges Raisins     Peaches Pears    Fruits  Plums Prunes   Fruit Juices Pineapple Watermelon    Grapefruit Raspberries   Cranberry Juice Orange Juice   Banana (over-ripe)     Strawberries Tangerines        Apple Juice Grapefruit Juice   Beans and Legumes Beverages  Tomato Juice    Boston-type baked beans Sodas, sweet tea, pineapple juice   Canned pinto, kidney, or navy beans   Beans and Legumes (fresh-cooked) Green peas Vegetables  Black-eyed peas Butter Beans    Potato, baked, boiled, fried, mashed  Chick peas Lentils   Vegetables French fries  Green beans Lima beans   Beets Carrots   Canned or frozen corn  Kidney beans Navy beans   Sweet potato Yam   Parsnips  Pinto beans Snow peas   Corn on the cob Winter squash      Non-starchy vegetables Grains Breads  Asparagus, avocado, broccoli, cabbage Cornmeal Rice, brown   Most breads (white and whole grain)  cauliflower, celery, cucumber, greens Rice, white Couscous   Bagels Bread sticks    lettuce, mushrooms, peppers, tomatoes  Bread stuffing Kaiser roll    okra, onions, spinach, summer squash Pasta Dinner rolls   Macaroni  Pizza, cheese     Grains Ravioli, meat filled Spaghetti, white   Grains  Barley Bulgur    Rice, instant Tapioca, with milk    Rye Wild rice   Nuts    Cashews Macadamia   Candy and most cookies  Nuts and oils    Almonds, peanuts, sunflower seeds Snacks Snacks  hazelnuts, pecans, walnuts Chocolate Ice cream, lowfat   Donuts Corn chips    Oils that are liquid at room temperature Muffin Popcorn   Jelly beans Pretzels      Pastries  Dairy, fish, meat, soy, and eggs    Milk, skim Lowfat cheese    Restaurant and ethnic foods  Yogurt, lowfat, fruit sugar sweetened  Most Chinese food (sugar in stir fry    or wok sauce)  Lean red meat Fish    Teriyaki-style meats and vegetables  Skinless chicken and turkey, shellfish        Egg whites (up to 3 daily), Soy Products    Egg yolks (up to 7 or _____ per week)      

## 2022-11-21 ENCOUNTER — Other Ambulatory Visit: Payer: Self-pay

## 2022-11-21 DIAGNOSIS — E7801 Familial hypercholesterolemia: Secondary | ICD-10-CM

## 2022-11-21 LAB — COMPREHENSIVE METABOLIC PANEL
ALT: 48 IU/L — ABNORMAL HIGH (ref 0–44)
AST: 27 IU/L (ref 0–40)
Albumin/Globulin Ratio: 1.9 (ref 1.2–2.2)
Albumin: 4.9 g/dL (ref 4.1–5.1)
Alkaline Phosphatase: 80 IU/L (ref 44–121)
BUN/Creatinine Ratio: 11 (ref 9–20)
BUN: 12 mg/dL (ref 6–20)
Bilirubin Total: 0.7 mg/dL (ref 0.0–1.2)
CO2: 22 mmol/L (ref 20–29)
Calcium: 9.8 mg/dL (ref 8.7–10.2)
Chloride: 100 mmol/L (ref 96–106)
Creatinine, Ser: 1.07 mg/dL (ref 0.76–1.27)
Globulin, Total: 2.6 g/dL (ref 1.5–4.5)
Glucose: 100 mg/dL — ABNORMAL HIGH (ref 70–99)
Potassium: 4.3 mmol/L (ref 3.5–5.2)
Sodium: 140 mmol/L (ref 134–144)
Total Protein: 7.5 g/dL (ref 6.0–8.5)
eGFR: 93 mL/min/{1.73_m2} (ref >59)

## 2022-11-21 LAB — LIPID PANEL WITH LDL/HDL RATIO
Cholesterol, Total: 253 mg/dL — ABNORMAL HIGH (ref 100–199)
HDL: 54 mg/dL (ref >39)
LDL Chol Calc (NIH): 171 mg/dL — ABNORMAL HIGH (ref 0–99)
LDL/HDL Ratio: 3.2 ratio (ref 0.0–3.6)
Triglycerides: 156 mg/dL — ABNORMAL HIGH (ref 0–149)
VLDL Cholesterol Cal: 28 mg/dL (ref 5–40)

## 2022-11-21 MED ORDER — EZETIMIBE 10 MG PO TABS: 10.0000 mg | ORAL_TABLET | Freq: Every day | ORAL | 1 refills | Status: DC

## 2022-12-13 ENCOUNTER — Other Ambulatory Visit: Payer: Self-pay | Admitting: Family Medicine

## 2022-12-13 DIAGNOSIS — E7801 Familial hypercholesterolemia: Secondary | ICD-10-CM

## 2022-12-13 NOTE — Telephone Encounter (Signed)
Requested Prescriptions  Refused Prescriptions Disp Refills   ezetimibe (ZETIA) 10 MG tablet [Pharmacy Med Name: EZETIMIBE 10 MG TABLET] 90 tablet 1    Sig: TAKE 1 TABLET BY MOUTH EVERY DAY     Cardiovascular:  Antilipid - Sterol Transport Inhibitors Failed - 12/13/2022  1:34 PM      Failed - ALT in normal range and within 360 days    ALT  Date Value Ref Range Status  11/20/2022 48 (H) 0 - 44 IU/L Final   SGPT (ALT)  Date Value Ref Range Status  09/25/2013 32 12 - 78 U/L Final         Failed - Lipid Panel in normal range within the last 12 months    Cholesterol, Total  Date Value Ref Range Status  11/20/2022 253 (H) 100 - 199 mg/dL Final   LDL Chol Calc (NIH)  Date Value Ref Range Status  11/20/2022 171 (H) 0 - 99 mg/dL Final   HDL  Date Value Ref Range Status  11/20/2022 54 >39 mg/dL Final   Triglycerides  Date Value Ref Range Status  11/20/2022 156 (H) 0 - 149 mg/dL Final         Passed - AST in normal range and within 360 days    AST  Date Value Ref Range Status  11/20/2022 27 0 - 40 IU/L Final   SGOT(AST)  Date Value Ref Range Status  09/25/2013 37 15 - 22 Unit/L Final         Passed - Patient is not pregnant      Passed - Valid encounter within last 12 months    Recent Outpatient Visits           3 weeks ago Primary hypertension   [place] [facility] [place], [place], MD   2 months ago Primary hypertension   [place] [facility] [place], Deanna C, MD   4 months ago Primary hypertension    [facility] [place], [place], MD   9 months ago Internal derangement of left knee   [facility] [place] [facility] [place], Earley Abide, MD   10 months ago Chronic pain of left knee   [facility] [place] [facility] [place], [place], MD       Future Appointments              In 5 months Juline Patch, MD [place] [facility]   In 6 months Diamantina [place], Herbert Seta, MD [facility] [place]

## 2023-01-02 ENCOUNTER — Other Ambulatory Visit: Payer: Self-pay | Admitting: Family Medicine

## 2023-01-02 DIAGNOSIS — I10 Essential (primary) hypertension: Secondary | ICD-10-CM

## 2023-01-03 NOTE — Telephone Encounter (Signed)
Requested Prescriptions  Refused Prescriptions Disp Refills   valsartan (DIOVAN) 160 MG tablet [Pharmacy Med Name: VALSARTAN 160 MG TABLET] 90 tablet 0    Sig: TAKE 1 TABLET BY MOUTH EVERY DAY     Cardiovascular:  Angiotensin Receptor Blockers Passed - 01/02/2023  1:40 AM      Passed - Cr in normal range and within 180 days    Creatinine  Date Value Ref Range Status  09/25/2013 1.10 0.60 - 1.30 mg/dL Final   Creatinine, Ser  Date Value Ref Range Status  11/20/2022 1.07 0.76 - 1.27 mg/dL Final         Passed - K in normal range and within 180 days    Potassium  Date Value Ref Range Status  11/20/2022 4.3 3.5 - 5.2 mmol/L Final  09/25/2013 4.1 3.5 - 5.1 mmol/L Final         Passed - Patient is not pregnant      Passed - Last BP in normal range    BP Readings from Last 1 Encounters:  11/20/22 104/62         Passed - Valid encounter within last 6 months    Recent Outpatient Visits           1 month ago Primary hypertension   [place] [facility], MD   2 months ago Primary hypertension   [place] [facility], MD   5 months ago Primary hypertension    [facility], MD   10 months ago Internal derangement of left knee   [facility], Ocie Bob, MD   10 months ago Chronic pain of left knee   [facility], MD       Future Appointments             In 4 months Duanne Limerick, MD [facility]   In 5 months Richardo Hanks, Laurette Schimke, MD [facility] [place]

## 2023-03-25 ENCOUNTER — Telehealth: Payer: Self-pay | Admitting: Family Medicine

## 2023-03-25 NOTE — Telephone Encounter (Signed)
Dr. Jerene Bears calling [facility] is calling to see if the patient is been followed for his cholesterol.  Calling to see if the patient has tried a statin. Please advise Does not call back  ( [phone]

## 2023-03-26 ENCOUNTER — Telehealth: Payer: Self-pay

## 2023-03-26 NOTE — Telephone Encounter (Signed)
Spoke to pt on phone after receiving a message from "Dr. Jerene Bears with [facility]." There is a concern that the pt is not taking a statin, which we have discussed with him in the past. I called pt and ran this past him again. He doesn't want to take a statin- he is willing to recheck his lipids fasting in 2 months after trying to diet. The number that Dr. Jerene Bears called from is [phone]

## 2023-04-06 IMAGING — CR DG KNEE COMPLETE 4+V*L*
4 series · 4 of 4 positions shown · non-contrast
Comparison: None Available.

CLINICAL DATA: L) knee pain and instability

EXAM:
LEFT KNEE - COMPLETE 4+ VIEW

[knee ap]
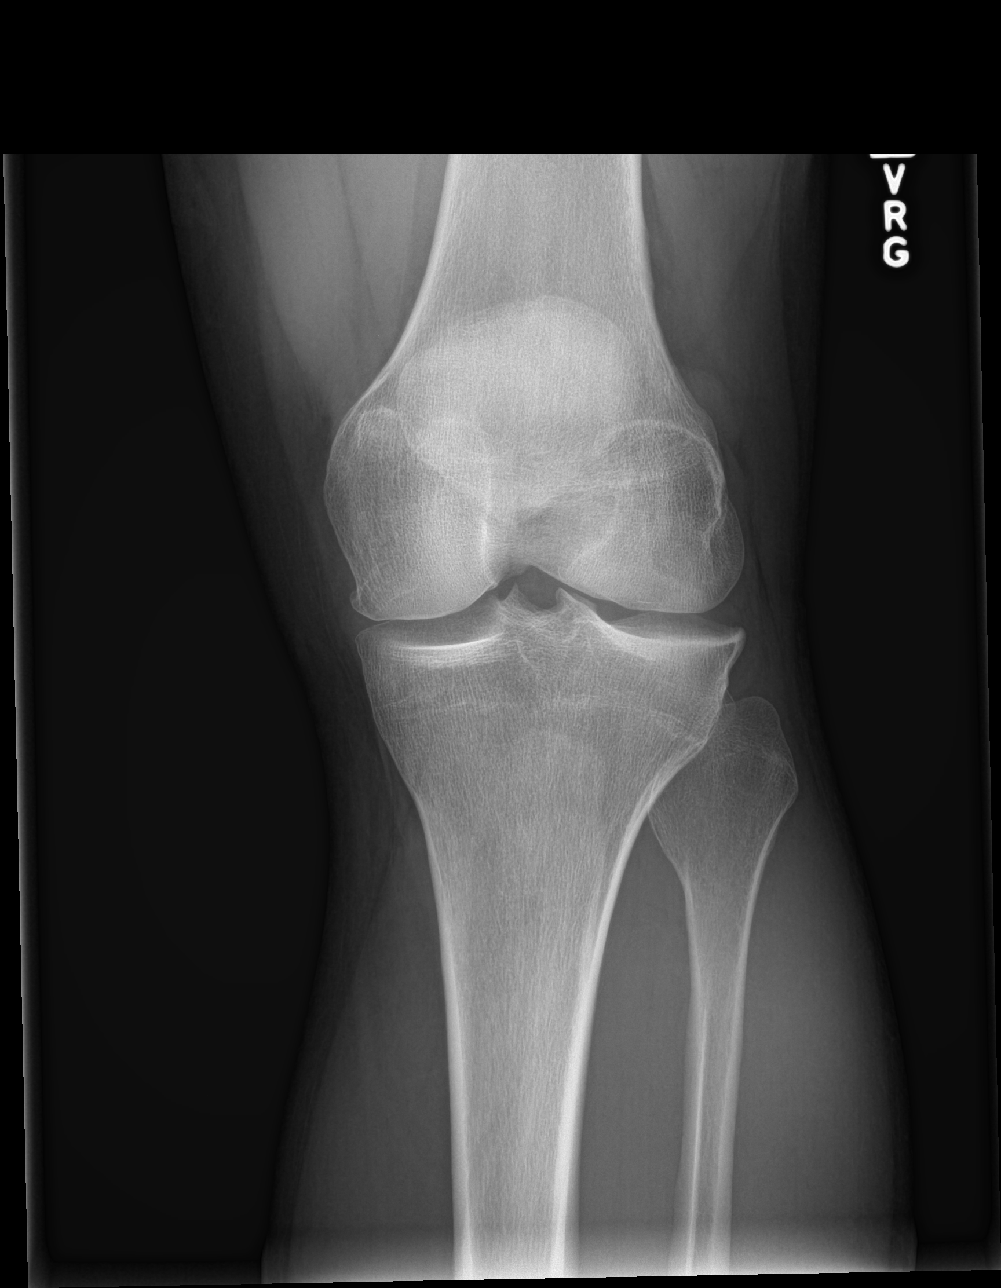

[knee lat]
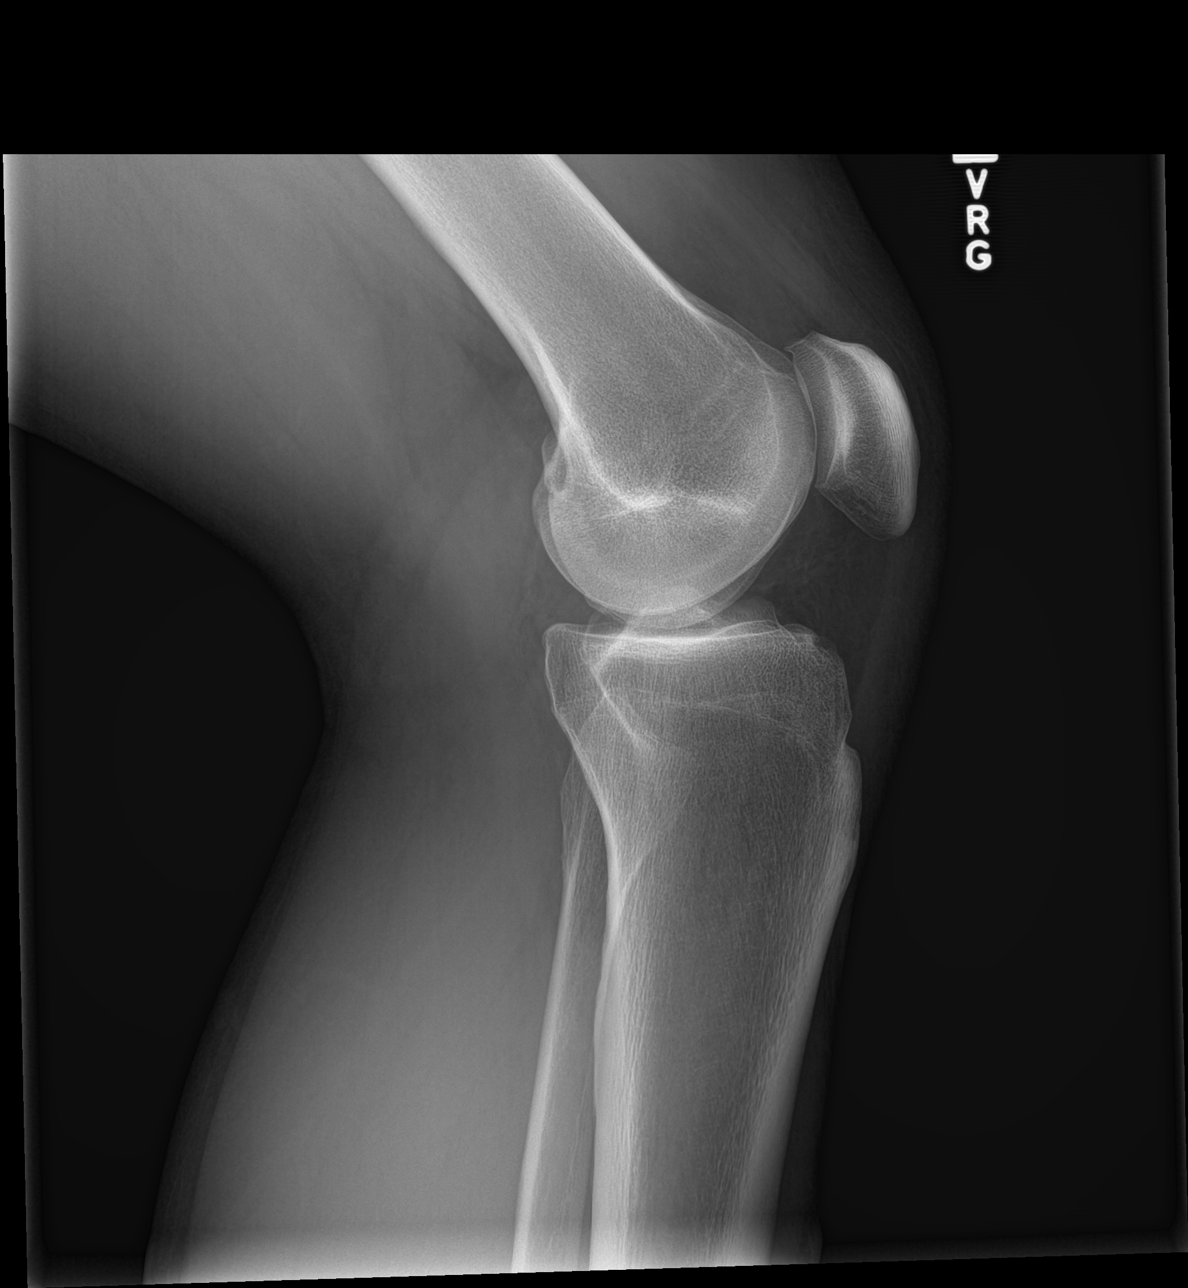

[tunnel]
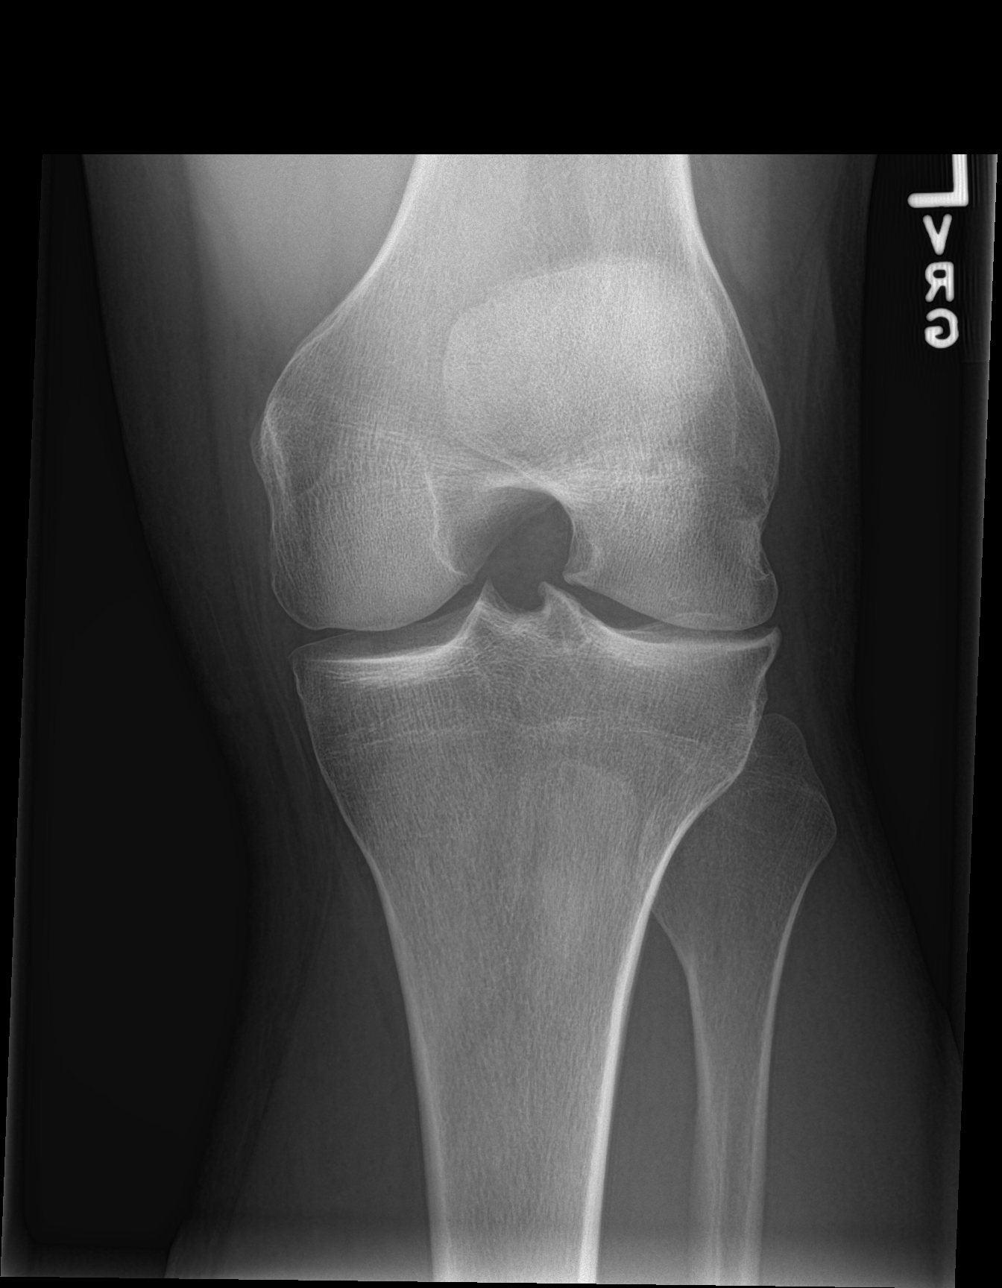

[patella skyline]
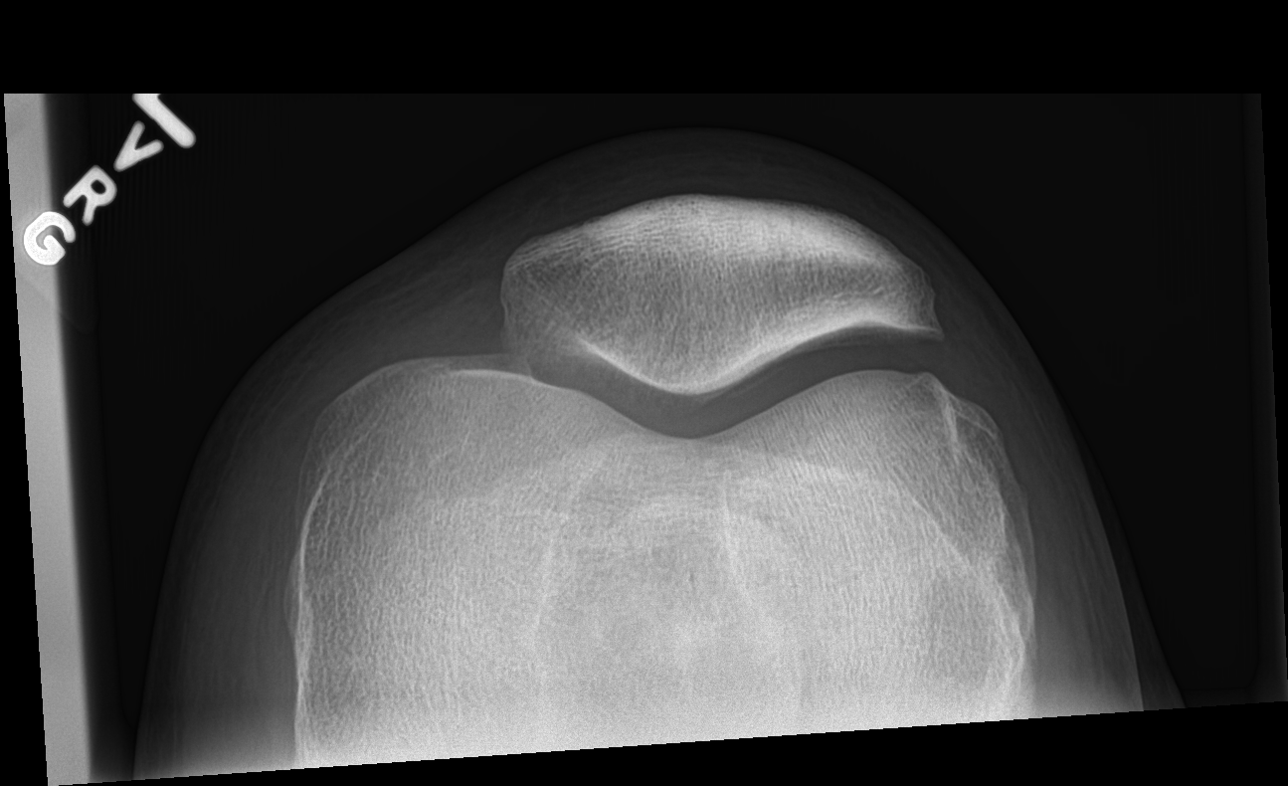

[4 of 4 positions shown; findings below may reference images not displayed]

FINDINGS: No acute fracture or dislocation. Minimal tricompartmental
degenerative changes. No area of erosion or osseous destruction. No
unexpected radiopaque foreign body. Soft tissues are unremarkable.
IMPRESSION: No acute fracture or dislocation.

## 2023-05-19 ENCOUNTER — Other Ambulatory Visit: Payer: Self-pay | Admitting: Family Medicine

## 2023-05-19 DIAGNOSIS — I10 Essential (primary) hypertension: Secondary | ICD-10-CM

## 2023-05-23 ENCOUNTER — Ambulatory Visit: Payer: 59 | Admitting: Family Medicine

## 2023-06-17 ENCOUNTER — Ambulatory Visit: Payer: 59 | Admitting: Family Medicine

## 2023-06-25 ENCOUNTER — Encounter: Payer: Self-pay | Admitting: Urology

## 2023-06-25 ENCOUNTER — Ambulatory Visit: Payer: 59 | Admitting: Urology

## 2023-06-25 DIAGNOSIS — N522 Drug-induced erectile dysfunction: Secondary | ICD-10-CM | POA: Diagnosis not present

## 2023-06-25 MED ORDER — TADALAFIL 5 MG PO TABS: 5.0000 mg | ORAL_TABLET | Freq: Every day | ORAL | 3 refills | Status: DC | PRN

## 2023-06-25 NOTE — Progress Notes (Signed)
   06/25/2023 9:18 AM   David Graham August 03, 1986 [id number]  Reason for visit: Follow up delayed orgasm/ejaculation, ED   HPI: 37 year old male on Remeron and Klonopin for anxiety who has had problems with delayed ejaculations and ED on those medications.  Testosterone was normal.  He has tried other medications but those were not as helpful for his anxiety.  Currently using Cialis daily to every other day with good results.  He does have some occasional problems with delayed ejaculation but definitely improved on the Cialis.  We discussed side effects of his anxiety medications are likely contributing to his delayed ejaculations.  Overall, satisfied on the Cialis.     Cialis 5 mg every other day refilled He prefers to continue yearly urology follow-up  Sondra Come, MD  [facility] [address] [place], [place] [address] [phone]

## 2023-07-09 ENCOUNTER — Ambulatory Visit: Payer: 59 | Admitting: Family Medicine

## 2023-07-09 VITALS — BP 128/78 | HR 76 | Ht 71.0 in | Wt 231.0 lb

## 2023-07-09 DIAGNOSIS — I10 Essential (primary) hypertension: Secondary | ICD-10-CM

## 2023-07-09 DIAGNOSIS — E7801 Familial hypercholesterolemia: Secondary | ICD-10-CM

## 2023-07-09 NOTE — Patient Instructions (Addendum)
GUIDELINES FOR  LOW-CHOLESTEROL, LOW-TRIGLYCERIDE DIETS    FOODS TO USE   MEATS, FISH Choose lean meats (chicken, [place], veal, and non-fatty cuts of beef with excess fat trimmed; one serving = 3 oz of cooked meat). Also, fresh or frozen fish, canned fish packed in water, and shellfish (lobster, crabs, shrimp, and oysters). Limit use to no more than one serving of one of these per week. Shellfish are high in cholesterol but low in saturated fat and should be used sparingly. Meats and fish should be broiled (pan or oven) or baked on a rack.  EGGS Egg substitutes and egg whites (use freely). Egg yolks (limit two per week).  FRUITS Eat three servings of fresh fruit per day (1 serving =  cup). Be sure to have at least one citrus fruit daily. Frozen and canned fruit with no sugar or syrup added may be used.  VEGETABLES Most vegetables are not limited (see next page). One dark-green (string beans, escarole) or one deep yellow (squash) vegetable is recommended daily. Cauliflower, broccoli, and celery, as well as potato skins, are recommended for their fiber content. (Fiber is associated with cholesterol reduction) It is preferable to steam vegetables, but they may be boiled, strained, or braised with polyunsaturated vegetable oil (see below).  BEANS Dried peas or beans (1 serving =  cup) may be used as a bread substitute.  NUTS Almonds, walnuts, and peanuts may be used sparingly  (1 serving = 1 Tablespoonful). Use pumpkin, sesame, or sunflower seeds.  BREADS, GRAINS One roll or one slice of whole grain or enriched bread may be used, or three soda crackers or four pieces of melba toast as a substitute. Spaghetti, rice or noodles ( cup) or  large ear of corn may be used as a bread substitute. In preparing these foods do not use butter or shortening, use soft margarine. Also use egg and sugar substitutes.  Choose high fiber grains, such as oats and whole wheat.  CEREALS Use  cup of hot cereal or  cup of  cold cereal per day. Add a sugar substitute if desired, with 99% fat free or skim milk.  MILK PRODUCTS Always use 99% fat free or skim milk, dairy products such as low fat cheeses (farmer's uncreamed diet cottage), low-fat yogurt, and powdered skim milk.  FATS, OILS Use soft (not stick) margarine; vegetable oils that are high in polyunsaturated fats (such as safflower, sunflower, soybean, corn, and cottonseed). Always refrigerate meat drippings to harden the fat and remove it before preparing gravies  DESSERTS, SNACKS Limit to two servings per day; substitute each serving for a bread/cereal serving: ice milk, water sherbet (1/4 cup); unflavored gelatin or gelatin flavored with sugar substitute (1/3 cup); pudding prepared with skim milk (1/2 cup); egg white souffls; unbuttered popcorn (1  cups). Substitute carob for chocolate.  BEVERAGES Fresh fruit juices (limit 4 oz per day); black coffee, plain or herbal teas; soft drinks with sugar substitutes; club soda, preferably salt-free; cocoa made with skim milk or nonfat dried milk and water (sugar substitute added if desired); clear broth. Alcohol: limit two servings per day (see second page).  MISCELLANEOUS  You may use the following freely: vinegar, spices, herbs, nonfat bouillon, mustard, Worcestershire sauce, soy sauce, flavoring essence.                  GUIDELINES FOR  LOW-CHOLESTEROL, LOW TRIGLYCERIDE DIETS    FOODS TO AVOID   MEATS, FISH Marbled beef, pork, bacon, sausage, and other pork products; fatty  fowl (duck, goose); skin and fat of [place] and chicken; processed meats; luncheon meats (salami, bologna); frankfurters and fast-food hamburgers (theyre loaded with fat); organ meats (kidneys, liver); canned fish packed in oil.  EGGS Limit egg yolks to two per week.   FRUITS Coconuts (rich in saturated fats).  VEGETABLES Avoid avocados. Starchy vegetables (potatoes, corn, lima beans, dried peas, beans) may be used only if  substitutes for a serving of bread or cereal. (Baked potato skin, however, is desirable for its fiber content.  BEANS Commercial baked beans with sugar and/or pork added.  NUTS Avoid nuts.  Limit peanuts and walnuts to one tablespoonful per day.  BREADS, GRAINS Any baked goods with shortening and/or sugar. Commercial mixes with dried eggs and whole milk. Avoid sweet rolls, doughnuts, breakfast pastries (Danish), and sweetened packaged cereals (the added sugar converts readily to triglycerides).  MILK PRODUCTS Whole milk and whole-milk packaged goods; cream; ice cream; whole-milk puddings, yogurt, or cheeses; nondairy cream substitutes.  FATS, OILS Butter, lard, animal fats, bacon drippings, gravies, cream sauces as well as palm and coconut oils. All these are high in saturated fats. Examine labels on cholesterol free products for hydrogenated fats. (These are oils that have been hardened into solids and in the process have become saturated.)  DESSERTS, SNACKS Fried snack foods like potato chips; chocolate; candies in general; jams, jellies, syrups; whole- milk puddings; ice cream and milk sherbets; hydrogenated peanut butter.  BEVERAGES Sugared fruit juices and soft drinks; cocoa made with whole milk and/or sugar. When using alcohol (1 oz liquor, 5 oz beer, or 2  oz dry table wine per serving), one serving must be substituted for one bread or cereal serving (limit, two servings of alcohol per day).   SPECIAL NOTES    Remember that even non-limited foods should be used in moderation. While on a cholesterol-lowering diet, be sure to avoid animal fats and marbled meats. 3. While on a triglyceride-lowering diet, be sure to avoid sweets and to control the amount of carbohydrates you eat (starchy foods such as flour, bread, potatoes).While on a tri-glyceride-lowering diet, be sure to avoid sweets Buy a good low-fat cookbook, such as the one published by the American Heart Association. Consult your physician  if you have any questions.               [facility] Low Glycemic Diet Plan   Low Glycemic Foods (20-49) Moderate Glycemic Foods (50-69) High Glycemic Foods (70-100)      Breakfast Creals Breakfast Cereals Breakfast Cereals  All Bran All-Bran Fruit'n Oats   Bran Buds Bran Chex   Cheerios Corn chex    Fiber One Oatmeal (not instant)   Just Right Mini-Wheats   Corn Flakes Cream of Wheat    Oat Bran Special K Swiss Muesli   Grape Nuts Grape Nut Flakes      Grits Nutri-Grain    Fruits and fruit juice: Fruits Puffed Rice Puffed Wheat    (Limit to 1-2 Servings per day) Banana (under-ride) Dates   Rice Chex Rice Krispies    Apples Apricots (fresh/dried)   Figs Grapes   Shredded Wheat Team    Blackberries Blueberries   Kiwi Mango   Total     Cherries Cranberries   Oranges Raisins     Peaches Pears    Fruits  Plums Prunes   Fruit Juices Pineapple Watermelon    Grapefruit Raspberries   Cranberry Juice Orange Juice   Banana (over-ripe)     Strawberries Tangerines  Apple Juice Grapefruit Juice   Beans and Legumes Beverages  Tomato Juice    [place]-type baked beans Sodas, sweet tea, pineapple juice   Canned pinto, kidney, or navy beans   Beans and Legumes (fresh-cooked) Green peas Vegetables  Black-eyed peas Butter Beans    Potato, baked, boiled, fried, mashed  Chick peas Lentils   Vegetables [place] fries  Green beans Lima beans   Beets Carrots   Canned or frozen corn  Kidney beans Navy beans   Sweet potato Yam   Parsnips  Pinto beans Snow peas   Corn on the cob Winter squash      Non-starchy vegetables Grains Breads  Asparagus, avocado, broccoli, cabbage Cornmeal Rice, brown   Most breads (white and whole grain)  cauliflower, celery, cucumber, greens Rice, white Couscous   Bagels Bread sticks    lettuce, mushrooms, peppers, tomatoes  Bread stuffing Kaiser roll    okra, onions, spinach, summer squash Pasta Dinner rolls   [employer], cheese     Grains Ravioli, meat filled Spaghetti, white   Grains  Barley Bulgur    Rice, instant Tapioca, with milk    Rye Wild rice   Nuts    Cashews Macadamia   Candy and most cookies  Nuts and oils    Almonds, peanuts, sunflower seeds Snacks Snacks  hazelnuts, pecans, walnuts Chocolate Ice cream, lowfat   Donuts Corn chips    Oils that are liquid at room temperature Muffin Popcorn   Jelly beans Pretzels      Pastries  Dairy, fish, meat, soy, and eggs    Milk, skim Lowfat cheese    Restaurant and ethnic foods  Yogurt, lowfat, fruit sugar sweetened  Most [place] food (sugar in stir fry    or wok sauce)  Lean red meat Fish    Teriyaki-style meats and vegetables  Skinless chicken and [place], shellfish        Egg whites (up to 3 daily), Soy Products    Egg yolks (up to 7 or _____ per week)     DASH Eating Plan DASH stands for Dietary Approaches to Stop Hypertension. The DASH eating plan is a healthy eating plan that has been shown to: Lower high blood pressure (hypertension). Reduce your risk for type 2 diabetes, heart disease, and stroke. Help with weight loss. What are tips for following this plan? Reading food labels Check food labels for the amount of salt (sodium) per serving. Choose foods with less than 5 percent of the Daily Value (DV) of sodium. In general, foods with less than 300 milligrams (mg) of sodium per serving fit into this eating plan. To find whole grains, look for the word "whole" as the first word in the ingredient list. Shopping Buy products labeled as "low-sodium" or "no salt added." Buy fresh foods. Avoid canned foods and pre-made or frozen meals. Cooking Try not to add salt when you cook. Use salt-free seasonings or herbs instead of table salt or sea salt. Check with your health care provider or pharmacist before using salt substitutes. Do not fry foods. Cook foods in healthy ways, such as baking, boiling, grilling, roasting, or  broiling. Cook using oils that are good for your heart. These include olive, canola, avocado, soybean, and sunflower oil. Meal planning  Eat a balanced diet. This should include: 4 or more servings of fruits and 4 or more servings of vegetables each day. Try to fill half of your plate with fruits and vegetables. 6-8 servings of whole grains  each day. 6 or less servings of lean meat, poultry, or fish each day. 1 oz is 1 serving. A 3 oz (85 g) serving of meat is about the same size as the palm of your hand. One egg is 1 oz (28 g). 2-3 servings of low-fat dairy each day. One serving is 1 cup (237 mL). 1 serving of nuts, seeds, or beans 5 times each week. 2-3 servings of heart-healthy fats. Healthy fats called omega-3 fatty acids are found in foods such as walnuts, flaxseeds, fortified milks, and eggs. These fats are also found in cold-water fish, such as sardines, salmon, and mackerel. Limit how much you eat of: Canned or prepackaged foods. Food that is high in trans fat, such as fried foods. Food that is high in saturated fat, such as fatty meat. Desserts and other sweets, sugary drinks, and other foods with added sugar. Full-fat dairy products. Do not salt foods before eating. Do not eat more than 4 egg yolks a week. Try to eat at least 2 vegetarian meals a week. Eat more home-cooked food and less restaurant, buffet, and fast food. Lifestyle When eating at a restaurant, ask if your food can be made with less salt or no salt. If you drink alcohol: Limit how much you have to: 0-1 drink a day if you are male. 0-2 drinks a day if you are male. Know how much alcohol is in your drink. In the [place] one drink is one 12 oz bottle of beer (355 mL), one 5 oz glass of wine (148 mL), or one 1 oz glass of hard liquor (44 mL). General information Avoid eating more than 2,300 mg of salt a day. If you have hypertension, you may need to reduce your sodium intake to 1,500 mg a day. Work with your  provider to stay at a healthy body weight or lose weight. Ask what the best weight range is for you. On most days of the week, get at least 30 minutes of exercise that causes your heart to beat faster. This may include walking, swimming, or biking. Work with your provider or dietitian to adjust your eating plan to meet your specific calorie needs. What foods should I eat? Fruits All fresh, dried, or frozen fruit. Canned fruits that are in their natural juice and do not have sugar added to them. Vegetables Fresh or frozen vegetables that are raw, steamed, roasted, or grilled. Low-sodium or reduced-sodium tomato and vegetable juice. Low-sodium or reduced-sodium tomato sauce and tomato paste. Low-sodium or reduced-sodium canned vegetables. Grains Whole-grain or whole-wheat bread. Whole-grain or whole-wheat pasta. Brown rice. Orpah Cobb. Bulgur. Whole-grain and low-sodium cereals. Pita bread. Low-fat, low-sodium crackers. Whole-wheat flour tortillas. Meats and other proteins Skinless chicken or [place]. Ground chicken or [place]. Pork with fat trimmed off. Fish and seafood. Egg whites. Dried beans, peas, or lentils. Unsalted nuts, nut butters, and seeds. Unsalted canned beans. Lean cuts of beef with fat trimmed off. Low-sodium, lean precooked or cured meat, such as sausages or meat loaves. Dairy Low-fat (1%) or fat-free (skim) milk. Reduced-fat, low-fat, or fat-free cheeses. Nonfat, low-sodium ricotta or cottage cheese. Low-fat or nonfat yogurt. Low-fat, low-sodium cheese. Fats and oils Soft margarine without trans fats. Vegetable oil. Reduced-fat, low-fat, or light mayonnaise and salad dressings (reduced-sodium). Canola, safflower, olive, avocado, soybean, and sunflower oils. Avocado. Seasonings and condiments Herbs. Spices. Seasoning mixes without salt. Other foods Unsalted popcorn and pretzels. Fat-free sweets. The items listed above may not be all the foods and drinks you can have.  Talk to a  dietitian to learn more. What foods should I avoid? Fruits Canned fruit in a light or heavy syrup. Fried fruit. Fruit in cream or butter sauce. Vegetables Creamed or fried vegetables. Vegetables in a cheese sauce. Regular canned vegetables that are not marked as low-sodium or reduced-sodium. Regular canned tomato sauce and paste that are not marked as low-sodium or reduced-sodium. Regular tomato and vegetable juices that are not marked as low-sodium or reduced-sodium. Rosita Fire. Olives. Grains Baked goods made with fat, such as croissants, muffins, or some breads. Dry pasta or rice meal packs. Meats and other proteins Fatty cuts of meat. Ribs. Fried meat. Tomasa Blase. Bologna, salami, and other precooked or cured meats, such as sausages or meat loaves, that are not lean and low in sodium. Fat from the back of a pig (fatback). Bratwurst. Salted nuts and seeds. Canned beans with added salt. Canned or smoked fish. Whole eggs or egg yolks. Chicken or [place] with skin. Dairy Whole or 2% milk, cream, and half-and-half. Whole or full-fat cream cheese. Whole-fat or sweetened yogurt. Full-fat cheese. Nondairy creamers. Whipped toppings. Processed cheese and cheese spreads. Fats and oils Butter. Stick margarine. Lard. Shortening. Ghee. Bacon fat. Tropical oils, such as coconut, palm kernel, or palm oil. Seasonings and condiments Onion salt, garlic salt, seasoned salt, table salt, and sea salt. Worcestershire sauce. Tartar sauce. Barbecue sauce. Teriyaki sauce. Soy sauce, including reduced-sodium soy sauce. Steak sauce. Canned and packaged gravies. Fish sauce. Oyster sauce. Cocktail sauce. Store-bought horseradish. Ketchup. Mustard. Meat flavorings and tenderizers. Bouillon cubes. Hot sauces. Pre-made or packaged marinades. Pre-made or packaged taco seasonings. Relishes. Regular salad dressings. Other foods Salted popcorn and pretzels. The items listed above may not be all the foods and drinks you should avoid. Talk  to a dietitian to learn more. Where to find more information National Heart, Lung, and Blood Institute (NHLBI): BuffaloDryCleaner.gl American Heart Association (AHA): heart.org Academy of Nutrition and Dietetics: eatright.org National Kidney Foundation (NKF): kidney.org This information is not intended to replace advice given to you by your health care provider. Make sure you discuss any questions you have with your health care provider. Document Revised: 09/20/2022 Document Reviewed: 09/20/2022 Elsevier Patient Education  2024 [employer].

## 2023-07-09 NOTE — Progress Notes (Signed)
Date:  07/09/2023   Name:  David Graham   DOB:  Jan 05, 1986   MRN:  [id number]   Chief Complaint: Hypertension (Hasn't taken BP meds in 1 month ) and Hyperlipidemia  Hypertension This is a chronic problem. The current episode started more than 1 year ago. The problem has been gradually improving since onset. The problem is controlled. Pertinent negatives include no anxiety, blurred vision, chest pain, headaches, malaise/fatigue, neck pain, orthopnea, palpitations, peripheral edema, PND, shortness of breath or sweats. There are no associated agents to hypertension. Risk factors for coronary artery disease include dyslipidemia. Past treatments include nothing. The current treatment provides moderate improvement. There are no compliance problems.  There is no history of CAD/MI or CVA. There is no history of chronic renal disease, a hypertension causing med or renovascular disease.  Hyperlipidemia This is a chronic problem. The current episode started more than 1 year ago. The problem is controlled. Recent lipid tests were reviewed and are normal. He has no history of chronic renal disease. Pertinent negatives include no chest pain, focal sensory loss, focal weakness, leg pain, myalgias or shortness of breath. Current antihyperlipidemic treatment includes diet change. The current treatment provides moderate improvement of lipids.    Lab Results  Component Value Date   NA 140 11/20/2022   K 4.3 11/20/2022   CO2 22 11/20/2022   GLUCOSE 100 (H) 11/20/2022   BUN 12 11/20/2022   CREATININE 1.07 11/20/2022   CALCIUM 9.8 11/20/2022   EGFR 93 11/20/2022   GFRNONAA >60 10/31/2021   Lab Results  Component Value Date   CHOL 253 (H) 11/20/2022   HDL 54 11/20/2022   LDLCALC 171 (H) 11/20/2022   TRIG 156 (H) 11/20/2022   Lab Results  Component Value Date   TSH 2.906 10/31/2021   No results found for: "HGBA1C" Lab Results  Component Value Date   WBC 3.5 (L) 10/31/2021   HGB 16.0 10/31/2021    HCT 43.3 10/31/2021   MCV 91.0 10/31/2021   PLT 140 (L) 10/31/2021   Lab Results  Component Value Date   ALT 48 (H) 11/20/2022   AST 27 11/20/2022   ALKPHOS 80 11/20/2022   BILITOT 0.7 11/20/2022   No results found for: "25OHVITD2", "25OHVITD3", "VD25OH"   Review of Systems  Constitutional:  Negative for chills, fever and malaise/fatigue.  HENT:  Negative for drooling, ear discharge, ear pain and sore throat.   Eyes:  Negative for blurred vision.  Respiratory:  Negative for cough, shortness of breath and wheezing.   Cardiovascular:  Negative for chest pain, palpitations, orthopnea, leg swelling and PND.  Gastrointestinal:  Negative for abdominal pain, blood in stool, constipation, diarrhea and nausea.  Endocrine: Negative for polydipsia.  Genitourinary:  Negative for dysuria, frequency, hematuria and urgency.  Musculoskeletal:  Negative for back pain, myalgias and neck pain.  Skin:  Negative for rash.  Allergic/Immunologic: Negative for environmental allergies.  Neurological:  Negative for dizziness, focal weakness and headaches.  Hematological:  Does not bruise/bleed easily.  Psychiatric/Behavioral:  Negative for suicidal ideas. The patient is not nervous/anxious.     Patient Active Problem List   Diagnosis Date Noted   Internal derangement of left knee 02/19/2022   Primary osteoarthritis of left knee 02/19/2022   Panic disorder 08/03/2018   Episodic mood disorder (HCC) 08/03/2018    Allergies  Allergen Reactions   Imipramine Hives   Oxcarbazepine    Viibryd [Vilazodone Hcl]     Past Surgical History:  Procedure Laterality  Date   left knee arthoscopy Left    ~2013 timeframe    Social History   Tobacco Use   Smoking status: Never    Passive exposure: Never   Smokeless tobacco: Former    Quit date: 10/31/2020   Tobacco comments:    occassional  Vaping Use   Vaping status: Never Used  Substance Use Topics   Alcohol use: Yes    Alcohol/week: 0.0 standard  drinks of alcohol   Drug use: No     Medication list has been reviewed and updated.  Current Meds  Medication Sig   clonazePAM (KLONOPIN) 0.5 MG tablet Take 1 tablet (0.5 mg total) by mouth 3 (three) times daily as needed for anxiety.   ezetimibe (ZETIA) 10 MG tablet Take 1 tablet (10 mg total) by mouth daily.   methylphenidate (RITALIN) 10 MG tablet Take 10 mg by mouth 2 (two) times daily.   mirtazapine (REMERON) 15 MG tablet TAKE 1 TABLET BY MOUTH EVERYDAY AT BEDTIME   tadalafil (CIALIS) 5 MG tablet Take 1 tablet (5 mg total) by mouth daily as needed for erectile dysfunction.   valsartan (DIOVAN) 80 MG tablet TAKE 1 TABLET BY MOUTH EVERY DAY       11/20/2022    9:50 AM 10/08/2022   10:52 AM 07/26/2022    4:22 PM 02/19/2022    1:12 PM  GAD 7 : Generalized Anxiety Score  Nervous, Anxious, on Edge 0 0 0 1  Control/stop worrying 0 0 0 1  Worry too much - different things 0 0 0 1  Trouble relaxing 0 0 0 1  Restless 0 0 0 1  Easily annoyed or irritable 0 0 0 1  Afraid - awful might happen 0 0 0 0  Total GAD 7 Score 0 0 0 6  Anxiety Difficulty Not difficult at all Not difficult at all Not difficult at all Not difficult at all       11/20/2022    9:50 AM 10/08/2022   10:52 AM 07/26/2022    4:21 PM  Depression screen PHQ 2/9  Decreased Interest 0 0 0  Down, Depressed, Hopeless 0 0 0  PHQ - 2 Score 0 0 0  Altered sleeping 0 0 0  Tired, decreased energy 0 0 0  Change in appetite 0 0 0  Feeling bad or failure about yourself  0 0 0  Trouble concentrating 0 0 0  Moving slowly or fidgety/restless 0 0 0  Suicidal thoughts 0 0 0  PHQ-9 Score 0 0 0  Difficult doing work/chores Not difficult at all Not difficult at all Not difficult at all    BP Readings from Last 3 Encounters:  07/09/23 128/78  06/25/23 127/82  11/20/22 104/62    Physical Exam Vitals and nursing note reviewed.  HENT:     Head: Normocephalic.     Right Ear: Tympanic membrane normal.     Left Ear: Tympanic  membrane normal.     Nose: Nose normal.     Mouth/Throat:     Mouth: Mucous membranes are moist.  Eyes:     Pupils: Pupils are equal, round, and reactive to light.  Cardiovascular:     Rate and Rhythm: Normal rate.     Heart sounds: No murmur heard.    No friction rub. No gallop.  Pulmonary:     Effort: No respiratory distress.     Breath sounds: No stridor. No wheezing, rhonchi or rales.  Chest:     Chest  wall: No tenderness.  Abdominal:     General: Bowel sounds are normal.     Palpations: There is no mass.     Tenderness: There is no abdominal tenderness. There is no guarding or rebound.     Hernia: No hernia is present.  Neurological:     Mental Status: He is alert.     Motor: No weakness.     Coordination: Coordination normal.     Wt Readings from Last 3 Encounters:  07/09/23 231 lb (104.8 kg)  06/25/23 239 lb (108.4 kg)  11/20/22 230 lb (104.3 kg)    BP 128/78   Pulse 76   Ht 5\' 11"  (1.803 m)   Wt 231 lb (104.8 kg)   SpO2 99%   BMI 32.22 kg/m   Assessment and Plan: 1. Primary hypertension Chronic.  Controlled.  Stable.  Patient previously was on ACE receptor blocker medication but started feeling some dizziness and stopped this on his own.  Patient said his blood pressure stayed in normal range at home and on examination here his blood pressure was 128/78.  Will continue to hold the medication at this time will check renal function panel to evaluate electrolytes and GFR. - Renal Function Panel  2. Familial hypercholesterolemia Chronic.  Perhaps controlled.  Stable.  Patient has been given low-cholesterol dietary guidelines.  Patient was supposed to be on Zetia 10 mg once a day however he was reluctant to go on this and has been trying to control this on his own I have given him low-cholesterol low triglyceride dietary guidelines and patient will return for lipid panel to be done fasting tomorrow morning. - Lipid Panel With LDL/HDL Ratio     Elizabeth Sauer, MD

## 2023-07-11 ENCOUNTER — Encounter: Payer: Self-pay | Admitting: Family Medicine

## 2023-07-11 ENCOUNTER — Other Ambulatory Visit: Payer: Self-pay | Admitting: Family Medicine

## 2023-07-11 LAB — LIPID PANEL WITH LDL/HDL RATIO
Cholesterol, Total: 235 mg/dL — ABNORMAL HIGH (ref 100–199)
HDL: 42 mg/dL (ref >39)
LDL Chol Calc (NIH): 167 mg/dL — ABNORMAL HIGH (ref 0–99)
LDL/HDL Ratio: 4 ratio — ABNORMAL HIGH (ref 0.0–3.6)
Triglycerides: 142 mg/dL (ref 0–149)
VLDL Cholesterol Cal: 26 mg/dL (ref 5–40)

## 2023-07-11 LAB — RENAL FUNCTION PANEL
Albumin: 4.8 g/dL (ref 4.1–5.1)
BUN/Creatinine Ratio: 9 (ref 9–20)
BUN: 11 mg/dL (ref 6–20)
CO2: 24 mmol/L (ref 20–29)
Calcium: 9.5 mg/dL (ref 8.7–10.2)
Chloride: 103 mmol/L (ref 96–106)
Creatinine, Ser: 1.21 mg/dL (ref 0.76–1.27)
Glucose: 103 mg/dL — ABNORMAL HIGH (ref 70–99)
Phosphorus: 3.1 mg/dL (ref 2.8–4.1)
Potassium: 4.4 mmol/L (ref 3.5–5.2)
Sodium: 141 mmol/L (ref 134–144)
eGFR: 80 mL/min/{1.73_m2} (ref >59)

## 2023-07-11 MED ORDER — ROSUVASTATIN CALCIUM 5 MG PO TABS: 5.0000 mg | ORAL_TABLET | Freq: Every day | ORAL | 1 refills | Status: DC

## 2023-11-12 ENCOUNTER — Other Ambulatory Visit: Payer: Self-pay | Admitting: Family Medicine

## 2024-02-11 ENCOUNTER — Encounter: Payer: Self-pay | Admitting: Family Medicine

## 2024-02-11 ENCOUNTER — Ambulatory Visit: Admitting: Family Medicine

## 2024-02-11 VITALS — BP 124/80 | HR 60 | Ht 71.0 in | Wt 228.2 lb

## 2024-02-11 DIAGNOSIS — E669 Obesity, unspecified: Secondary | ICD-10-CM

## 2024-02-11 DIAGNOSIS — M2392 Unspecified internal derangement of left knee: Secondary | ICD-10-CM

## 2024-02-11 DIAGNOSIS — I10 Essential (primary) hypertension: Secondary | ICD-10-CM

## 2024-02-11 DIAGNOSIS — E7801 Familial hypercholesterolemia: Secondary | ICD-10-CM

## 2024-02-11 MED ORDER — SEMAGLUTIDE-WEIGHT MANAGEMENT 0.25 MG/0.5ML ~~LOC~~ SOAJ: 0.2500 mg | SUBCUTANEOUS | 0 refills | Status: DC

## 2024-02-11 NOTE — Progress Notes (Signed)
   Established Patient Office Visit  Subjective   Patient ID: David Graham, male    DOB: 30-Jun-1986  Age: 38 y.o. MRN: [id number]  Chief Complaint  Patient presents with   Hypertension   Hyperlipidemia    Not taking cholesterol medications, made him not feel well, so he stop taking it.     Assessment & Plan:   Problem List Items Addressed This Visit       Musculoskeletal and Integument   Internal derangement of left knee   Relevant Medications   Semaglutide-Weight Management 0.25 MG/0.5ML SOAJ   Other Visit Diagnoses       Obesity (BMI 30-39.9)    -  Primary   Relevant Medications   Semaglutide-Weight Management 0.25 MG/0.5ML SOAJ     Familial hypercholesterolemia       Relevant Medications   propranolol (INDERAL) 20 MG tablet   Semaglutide-Weight Management 0.25 MG/0.5ML SOAJ     Primary hypertension       Relevant Medications   propranolol (INDERAL) 20 MG tablet   Semaglutide-Weight Management 0.25 MG/0.5ML SOAJ       Return in about 4 weeks (around 03/10/2024).   He wants to establish care and mainly wants to discuss about weight loss. Pt is interested in GLPS. Reports he has bad left knee. Hx of meniscal repair.  He wants to be physically active but wants to protect his knee at all cost. So he is interested in trying GLP'S.  He is dieting and walking uphill with no significant weight loss. He also cut down on alcohol use.    Discussed side effects of GLP injectables : gastrointestinal, including nausea, vomiting, diarrhea, and constipation. Other side effects can include  pancreatitis, diabetic retinopathy, and allergic reactions, although these are less common       Review of Systems  All other systems reviewed and are negative.     Objective:     BP 124/80   Pulse 60   Ht 5\' 11"  (1.803 m)   Wt 228 lb 4 oz (103.5 kg)   SpO2 100%   BMI 31.83 kg/m    Physical Exam Vitals and nursing note reviewed.  Constitutional:      Appearance: Normal  appearance. He is obese.  HENT:     Head: Normocephalic.     Right Ear: External ear normal.     Left Ear: External ear normal.  Eyes:     Conjunctiva/sclera: Conjunctivae normal.  Cardiovascular:     Rate and Rhythm: Normal rate.  Pulmonary:     Effort: Pulmonary effort is normal. No respiratory distress.  Abdominal:     Palpations: Abdomen is soft.  Musculoskeletal:        General: Normal range of motion.  Skin:    General: Skin is warm.  Neurological:     Mental Status: He is alert and oriented to person, place, and time.  Psychiatric:        Mood and Affect: Mood normal.      No results found for any visits on 02/11/24.    The ASCVD Risk score (Arnett DK, et al., 2019) failed to calculate for the following reasons:   The 2019 ASCVD risk score is only valid for ages 50 to 38      Oneta Bilberry, MD

## 2024-02-12 ENCOUNTER — Telehealth: Payer: Self-pay | Admitting: Family Medicine

## 2024-02-12 NOTE — Telephone Encounter (Signed)
 Copied from CRM [id number]. Topic: Clinical - Medication Prior Auth >> Feb 12, 2024 12:11 PM Hamp Levine R wrote: Reason for CRM: Patient is calling stating his insurance is requiring prior authorization for him to get [facility]. States he an Dr. Norma Beckers spoke about it at his last visit.   Patient can be reached at [phone] for additional information

## 2024-02-12 NOTE — Telephone Encounter (Signed)
 Please complete PA for this patient.  KP

## 2024-02-13 ENCOUNTER — Telehealth: Payer: Self-pay | Admitting: Pharmacy Technician

## 2024-02-13 ENCOUNTER — Other Ambulatory Visit (HOSPITAL_COMMUNITY): Payer: Self-pay

## 2024-02-13 NOTE — Telephone Encounter (Signed)
 Pharmacy Patient Advocate Encounter   Received notification from Pt Calls Messages that prior authorization for Wegovy 0.25MG /0.5ML auto-injectors is required/requested.   Insurance verification completed.   The patient is insured through [facility] .   Per test claim: PA required; PA submitted to above mentioned insurance via CoverMyMeds Key/confirmation #/[id number] Status is pending

## 2024-02-13 NOTE — Telephone Encounter (Signed)
 PA request has been Submitted. New Encounter has been or will be created for follow up. For additional info see Pharmacy Prior Auth telephone encounter from 02/13/24.

## 2024-02-13 NOTE — Telephone Encounter (Signed)
 Pharmacy Patient Advocate Encounter  Received notification from [facility] that Prior Authorization for [facility] 0.25MG /0.5ML auto-injectors has been APPROVED from 02/13/24 to 09/10/24. Ran test claim, Copay is $24.99. This test claim was processed through [facility]- copay amounts may vary at other pharmacies due to pharmacy/plan contracts, or as the patient moves through the different stages of their insurance plan.   PA #/Case ID/Reference #: [id number]

## 2024-02-14 ENCOUNTER — Other Ambulatory Visit (HOSPITAL_COMMUNITY): Payer: Self-pay

## 2024-03-05 ENCOUNTER — Encounter: Payer: Self-pay | Admitting: Family Medicine

## 2024-03-05 ENCOUNTER — Ambulatory Visit: Admitting: Family Medicine

## 2024-03-05 VITALS — BP 120/80 | HR 59 | Ht 71.0 in | Wt 221.0 lb

## 2024-03-05 DIAGNOSIS — E7801 Familial hypercholesterolemia: Secondary | ICD-10-CM | POA: Diagnosis not present

## 2024-03-05 DIAGNOSIS — Z713 Dietary counseling and surveillance: Secondary | ICD-10-CM

## 2024-03-05 DIAGNOSIS — M2392 Unspecified internal derangement of left knee: Secondary | ICD-10-CM

## 2024-03-05 DIAGNOSIS — I10 Essential (primary) hypertension: Secondary | ICD-10-CM

## 2024-03-05 DIAGNOSIS — E669 Obesity, unspecified: Secondary | ICD-10-CM

## 2024-03-05 MED ORDER — SEMAGLUTIDE-WEIGHT MANAGEMENT 0.5 MG/0.5ML ~~LOC~~ SOAJ: 0.5000 mg | SUBCUTANEOUS | 2 refills | Status: DC

## 2024-03-05 NOTE — Progress Notes (Signed)
   Established Patient Office Visit  Subjective   Patient ID: David Graham, male    DOB: May 03, 1986  Age: 38 y.o. MRN: [id number]  Chief Complaint  Patient presents with   Weight Loss    Pt wants another wegoy shot.      Assessment & Plan:   Problem List Items Addressed This Visit       Musculoskeletal and Integument   Internal derangement of left knee   Other Visit Diagnoses       Obesity (BMI 30-39.9)    -  Primary   Relevant Medications   Semaglutide -Weight Management 0.5 MG/0.5ML SOAJ     Encounter for weight loss counseling         Familial hypercholesterolemia         Primary hypertension         Patient responding well to wegovy , will increase dose to 05 mg weekly, benefit him with further weight loss and help with his knee pain.  Return in about 3 months (around 06/05/2024).   Here for weight loss encounter follow up. He started using Wegovy  and feels its helping him loose weight. He is trying to eat healthy and exercise. He denies side effects. He is interested in increasing the dose and continue on that dose for few months.  He feels its also helping with his knee pain.   Wt Readings from Last 3 Encounters: 03/05/24 : 221 lb (100.2 kg) 02/11/24 : 228 lb 4 oz (103.5 kg) 07/09/23 : 231 lb (104.8 kg)        Review of Systems  All other systems reviewed and are negative.     Objective:     BP 120/80   Pulse (!) 59   Ht 5' 11 (1.803 m)   Wt 221 lb (100.2 kg)   SpO2 96%   BMI 30.82 kg/m    Physical Exam Vitals and nursing note reviewed.  Constitutional:      Appearance: Normal appearance.  HENT:     Head: Normocephalic.     Right Ear: External ear normal.     Left Ear: External ear normal.   Eyes:     Conjunctiva/sclera: Conjunctivae normal.    Cardiovascular:     Rate and Rhythm: Normal rate.  Pulmonary:     Effort: Pulmonary effort is normal. No respiratory distress.  Abdominal:     Palpations: Abdomen is soft.    Musculoskeletal:        General: Normal range of motion.   Skin:    General: Skin is warm.   Neurological:     Mental Status: He is alert and oriented to person, place, and time.   Psychiatric:        Mood and Affect: Mood normal.      No results found for any visits on 03/05/24.    The ASCVD Risk score (Arnett DK, et al., 2019) failed to calculate for the following reasons:   The 2019 ASCVD risk score is only valid for ages 26 to 92      Oneta Bilberry, MD

## 2024-03-10 ENCOUNTER — Ambulatory Visit: Admitting: Family Medicine

## 2024-03-23 ENCOUNTER — Telehealth: Payer: Self-pay | Admitting: Family Medicine

## 2024-03-23 NOTE — Telephone Encounter (Signed)
 Patient may want to check his insurance benefits as testosterone  levels is not a routine lab & may go towards pts deductible.   Copied from CRM [id number]. Topic: Clinical - Request for Lab/Test Order >> Mar 23, 2024 12:23 PM [place] B wrote: Pt called in request lab orders to have testosterone  levels checked

## 2024-03-24 ENCOUNTER — Other Ambulatory Visit: Payer: Self-pay | Admitting: Family Medicine

## 2024-03-24 DIAGNOSIS — R5383 Other fatigue: Secondary | ICD-10-CM

## 2024-03-24 NOTE — Telephone Encounter (Signed)
 Order place per pt request. Please let pt know it may note be covered by insurance.

## 2024-03-24 NOTE — Telephone Encounter (Signed)
 Spoke with patient and informed him. He said no worries. Patient verbalized understanding.  JM

## 2024-03-25 LAB — TESTOSTERONE,FREE AND TOTAL
Testosterone, Free: 10.1 pg/mL (ref 8.7–25.1)
Testosterone: 352 ng/dL (ref 264–916)

## 2024-03-26 ENCOUNTER — Encounter: Payer: Self-pay | Admitting: Family Medicine

## 2024-03-26 ENCOUNTER — Ambulatory Visit: Admitting: Family Medicine

## 2024-03-26 VITALS — BP 142/96 | HR 62 | Ht 71.0 in | Wt 211.2 lb

## 2024-03-26 DIAGNOSIS — R6882 Decreased libido: Secondary | ICD-10-CM | POA: Insufficient documentation

## 2024-03-26 DIAGNOSIS — E7801 Familial hypercholesterolemia: Secondary | ICD-10-CM | POA: Diagnosis not present

## 2024-03-26 DIAGNOSIS — I10 Essential (primary) hypertension: Secondary | ICD-10-CM | POA: Insufficient documentation

## 2024-03-26 DIAGNOSIS — R5383 Other fatigue: Secondary | ICD-10-CM

## 2024-03-26 DIAGNOSIS — F39 Unspecified mood [affective] disorder: Secondary | ICD-10-CM

## 2024-03-26 MED ORDER — OLMESARTAN MEDOXOMIL 5 MG PO TABS: 5.0000 mg | ORAL_TABLET | Freq: Every day | ORAL | 0 refills | Status: DC

## 2024-03-26 NOTE — Progress Notes (Signed)
 Established Patient Office Visit  Subjective   Patient ID: David Graham, male    DOB: 04/16/1986  Age: 38 y.o. MRN: [id number]  Chief Complaint  Patient presents with   Fatigue    Patient would like to discuss supplement for trestolone      Assessment & Plan:   Problem List Items Addressed This Visit       Cardiovascular and Mediastinum   Primary hypertension - Primary   Uncontrolled, patient not taking losartan as prescribed.  Discussed about starting Benicar  5 mg daily.  Also recommend ambulatory blood pressure monitoring, DASH diet, exercise.      Relevant Medications   olmesartan  (BENICAR ) 5 MG tablet     Other   Episodic mood disorder (HCC)   Managed by psychiatry, not taking Remeron  as prescribed.  Discussed to follow-up with psychiatry.      Low libido   Encouraged to focus on lifestyle changes, healthy diet, exercise.  Will do routine labs.  Discussed to reach out to urology in regards to further management.      Relevant Orders   TSH   Familial hypercholesterolemia   Recheck lipid panel.  Patient currently not taking Zetia  and Crestor /      Relevant Medications   olmesartan  (BENICAR ) 5 MG tablet   Other Relevant Orders   Lipid panel   Other Visit Diagnoses       Other fatigue       Relevant Orders   CBC with Differential/Platelet   Comprehensive metabolic panel with GFR       Return in about 4 weeks (around 04/23/2024) for HTN with PCP.   38 year old male presents to the clinic due to concerns of low energy levels, decreased libido.  Reports that he has been physically active and has been taking supplements but his energy levels remain low.  He also takes energy drinks and coffee to boost his energy levels.  Reports he works for [employer] and outdoor all the time.  Reports he is friend has been taking Clomed and is requesting if he could get prescription for Clomed.  Patient's blood pressure in office elevated today.  He has been on losartan in  the past and he stopped taking it for unclear reasons.  He reports his blood pressure fluctuates.  He is agreeing to restart his medication.  Discussed with the patient about starting Benicar  instead of valsartan  for better blood pressure control.  Patient verbalized understanding.  Depression/anxiety: Managed by psychiatry. Currently on Klonopin  and propranolol.  Reports he is not taking Remeron  as scheduled but takes it as needed.  Patient is overdue for his routine labs.      Review of Systems  All other systems reviewed and are negative.     Objective:     BP (!) 142/96   Pulse 62   Ht 5' 11 (1.803 m)   Wt 211 lb 4 oz (95.8 kg)   SpO2 99%   BMI 29.46 kg/m    Physical Exam Vitals and nursing note reviewed.  Constitutional:      Appearance: Normal appearance.  HENT:     Head: Normocephalic.     Right Ear: External ear normal.     Left Ear: External ear normal.  Eyes:     Conjunctiva/sclera: Conjunctivae normal.  Cardiovascular:     Rate and Rhythm: Normal rate.  Pulmonary:     Effort: Pulmonary effort is normal. No respiratory distress.  Abdominal:     Palpations: Abdomen is  soft.  Musculoskeletal:        General: Normal range of motion.  Skin:    General: Skin is warm.  Neurological:     Mental Status: He is alert and oriented to person, place, and time.  Psychiatric:        Mood and Affect: Mood normal.      No results found for any visits on 03/26/24.    The ASCVD Risk score (Arnett DK, et al., 2019) failed to calculate for the following reasons:   The 2019 ASCVD risk score is only valid for ages 61 to 45      Ladoris MARLA Ny, MD

## 2024-03-26 NOTE — Assessment & Plan Note (Signed)
 Recheck lipid panel.  Patient currently not taking Zetia  and Crestor /

## 2024-03-26 NOTE — Assessment & Plan Note (Signed)
 Uncontrolled, patient not taking losartan as prescribed.  Discussed about starting Benicar  5 mg daily.  Also recommend ambulatory blood pressure monitoring, DASH diet, exercise.

## 2024-03-26 NOTE — Assessment & Plan Note (Signed)
 Managed by psychiatry, not taking Remeron  as prescribed.  Discussed to follow-up with psychiatry.

## 2024-03-26 NOTE — Assessment & Plan Note (Signed)
 Encouraged to focus on lifestyle changes, healthy diet, exercise.  Will do routine labs.  Discussed to reach out to urology in regards to further management.

## 2024-03-27 ENCOUNTER — Ambulatory Visit: Admitting: Family Medicine

## 2024-03-28 ENCOUNTER — Ambulatory Visit: Payer: Self-pay | Admitting: Family Medicine

## 2024-03-28 LAB — COMPREHENSIVE METABOLIC PANEL WITH GFR
ALT: 31 IU/L (ref 0–44)
AST: 22 IU/L (ref 0–40)
Albumin: 4.8 g/dL (ref 4.1–5.1)
Alkaline Phosphatase: 70 IU/L (ref 44–121)
BUN/Creatinine Ratio: 10 (ref 9–20)
BUN: 10 mg/dL (ref 6–20)
Bilirubin Total: 0.7 mg/dL (ref 0.0–1.2)
CO2: 21 mmol/L (ref 20–29)
Calcium: 9.8 mg/dL (ref 8.7–10.2)
Chloride: 99 mmol/L (ref 96–106)
Creatinine, Ser: 1.05 mg/dL (ref 0.76–1.27)
Globulin, Total: 2.6 g/dL (ref 1.5–4.5)
Glucose: 95 mg/dL (ref 70–99)
Potassium: 5 mmol/L (ref 3.5–5.2)
Sodium: 136 mmol/L (ref 134–144)
Total Protein: 7.4 g/dL (ref 6.0–8.5)
eGFR: 94 mL/min/1.73 (ref >59)

## 2024-03-28 LAB — LIPID PANEL
Chol/HDL Ratio: 4.5 ratio (ref 0.0–5.0)
Cholesterol, Total: 224 mg/dL — ABNORMAL HIGH (ref 100–199)
HDL: 50 mg/dL (ref >39)
LDL Chol Calc (NIH): 155 mg/dL — ABNORMAL HIGH (ref 0–99)
Triglycerides: 104 mg/dL (ref 0–149)
VLDL Cholesterol Cal: 19 mg/dL (ref 5–40)

## 2024-03-28 LAB — CBC WITH DIFFERENTIAL/PLATELET
Basophils Absolute: 0 x10E3/uL (ref 0.0–0.2)
Basos: 1 %
EOS (ABSOLUTE): 0.2 x10E3/uL (ref 0.0–0.4)
Eos: 4 %
Hematocrit: 47.3 % (ref 37.5–51.0)
Hemoglobin: 16.1 g/dL (ref 13.0–17.7)
Immature Grans (Abs): 0 x10E3/uL (ref 0.0–0.1)
Immature Granulocytes: 0 %
Lymphocytes Absolute: 1.6 x10E3/uL (ref 0.7–3.1)
Lymphs: 37 %
MCH: 31.9 pg (ref 26.6–33.0)
MCHC: 34 g/dL (ref 31.5–35.7)
MCV: 94 fL (ref 79–97)
Monocytes Absolute: 0.5 x10E3/uL (ref 0.1–0.9)
Monocytes: 11 %
Neutrophils Absolute: 2 x10E3/uL (ref 1.4–7.0)
Neutrophils: 47 %
Platelets: 178 x10E3/uL (ref 150–450)
RBC: 5.04 x10E6/uL (ref 4.14–5.80)
RDW: 12.4 % (ref 11.6–15.4)
WBC: 4.3 x10E3/uL (ref 3.4–10.8)

## 2024-03-28 LAB — TSH: TSH: 2.57 u[IU]/mL (ref 0.450–4.500)

## 2024-04-02 ENCOUNTER — Encounter: Payer: Self-pay | Admitting: Family Medicine

## 2024-04-02 ENCOUNTER — Other Ambulatory Visit: Payer: Self-pay | Admitting: Family Medicine

## 2024-04-02 DIAGNOSIS — I10 Essential (primary) hypertension: Secondary | ICD-10-CM

## 2024-04-02 MED ORDER — VALSARTAN 80 MG PO TABS: 80.0000 mg | ORAL_TABLET | Freq: Every day | ORAL | 3 refills | Status: AC

## 2024-04-02 NOTE — Telephone Encounter (Signed)
 Please review and advice patient's message.

## 2024-04-05 NOTE — Progress Notes (Unsigned)
 04/07/2024 8:58 AM   David Graham [birth year] [id number]  Referring provider: Kotturi, Vinay K, MD [address] [place],  [place] [address]  Urological history: 1. ED - tadalafil  5 mg, every other day  2. Delayed ejaculation  Chief Complaint  Patient presents with   Erectile Dysfunction   HPI: David Graham is a 38 y.o. man who presents today for the complaints of low energy and low sex drive.    Previous records reviewed.   His PCP checked an afternoon testosterone  and it returned at 352.  He had a late morning testosterone  checked 2 years ago and it returned at 639.   This am testosterone  level is pending.   He states he is having fatigue, low libido and no motivation for the last several months.  He also admits that he has had more stress than usual.  He also read that consuming alcohol can suppress your testosterone  production, so he has abstained recently.   He also states he has been having poor sleep, but he denies any snoring or witnessed apneic events.    Lab Results  Component Value Date   TESTOSTERONE  352 03/24/2024   TSH (03/2024) 2.570  CMP     Component Value Date/Time   NA 136 03/27/2024 0845   NA 136 09/25/2013 1151   K 5.0 03/27/2024 0845   K 4.1 09/25/2013 1151   CL 99 03/27/2024 0845   CL 103 09/25/2013 1151   CO2 21 03/27/2024 0845   CO2 31 09/25/2013 1151   GLUCOSE 95 03/27/2024 0845   GLUCOSE 114 (H) 10/31/2021 0915   GLUCOSE 75 09/25/2013 1151   BUN 10 03/27/2024 0845   BUN 13 09/25/2013 1151   CREATININE 1.05 03/27/2024 0845   CREATININE 1.10 09/25/2013 1151   CALCIUM  9.8 03/27/2024 0845   CALCIUM  9.7 09/25/2013 1151   PROT 7.4 03/27/2024 0845   PROT 8.4 (H) 09/25/2013 1151   ALBUMIN 4.8 03/27/2024 0845   ALBUMIN 4.4 09/25/2013 1151   AST 22 03/27/2024 0845   AST 37 09/25/2013 1151   ALT 31 03/27/2024 0845   ALT 32 09/25/2013 1151   ALKPHOS 70 03/27/2024 0845   ALKPHOS 81 09/25/2013 1151   BILITOT 0.7 03/27/2024 0845    BILITOT 0.6 09/25/2013 1151   EGFR 94 03/27/2024 0845   GFRNONAA >60 10/31/2021 0915   GFRNONAA >60 09/25/2013 1151    PMH: Past Medical History:  Diagnosis Date   ADHD (attention deficit hyperactivity disorder)    Anxiety     Surgical History: Past Surgical History:  Procedure Laterality Date   left knee arthoscopy Left    ~2013 timeframe    Home Medications:  Allergies as of 04/07/2024       Reactions   Imipramine  Hives   Oxcarbazepine    Viibryd [vilazodone Hcl]         Medication List        Accurate as of April 07, 2024  8:58 AM. If you have any questions, ask your nurse or doctor.          clonazePAM  0.5 MG tablet Commonly known as: KLONOPIN  Take 1 tablet (0.5 mg total) by mouth 3 (three) times daily as needed for anxiety.   ezetimibe  10 MG tablet Commonly known as: Zetia  Take 1 tablet (10 mg total) by mouth daily.   methylphenidate  10 MG tablet Commonly known as: RITALIN  Take 10 mg by mouth 2 (two) times daily.   mirtazapine  15 MG tablet  Commonly known as: REMERON  TAKE 1 TABLET BY MOUTH EVERYDAY AT BEDTIME   propranolol 20 MG tablet Commonly known as: INDERAL Take 20 mg by mouth 2 (two) times daily as needed.   rosuvastatin  5 MG tablet Commonly known as: CRESTOR  TAKE 1 TABLET (5 MG TOTAL) BY MOUTH DAILY.   Semaglutide -Weight Management 0.5 MG/0.5ML Soaj Inject 0.5 mg into the skin once a week.   tadalafil  5 MG tablet Commonly known as: Cialis  Take 1 tablet (5 mg total) by mouth daily as needed for erectile dysfunction.   valsartan  80 MG tablet Commonly known as: DIOVAN  Take 1 tablet (80 mg total) by mouth daily.        Allergies:  Allergies  Allergen Reactions   Imipramine  Hives   Oxcarbazepine    Viibryd [Vilazodone Hcl]     Family History: No family history on file.  Social History: See HPI for pertinent social history  ROS: Pertinent ROS in HPI  Physical Exam: BP 129/85   Pulse 71   Ht 5' 11 (1.803 m)   Wt  211 lb (95.7 kg)   BMI 29.43 kg/m   Constitutional:  Well nourished. Alert and oriented, No acute distress. HEENT: [place] AT, moist mucus membranes.  Trachea midline Cardiovascular: No clubbing, cyanosis, or edema. Respiratory: Normal respiratory effort, no increased work of breathing. Neurologic: Grossly intact, no focal deficits, moving all 4 extremities. Psychiatric: Normal mood and affect.  Laboratory Data: See EPIC and HPI  I have reviewed the labs.   Pertinent Imaging: N/A  Assessment & Plan:    1. Low libido - explained that he will need two testosterones drawn before 9 AM at least two days apart to diagnose low testosterone  and he does not meet criteria at this time - discussed possible causes including hormonal imbalance, stress, sleep, medications and relationship factors - advised regular exercise, good sleep hygiene, and stress reduction - will follow up once labs are available to discuss further management  - If his testosterone  returns at low normal levels, he would like to consider Clomid  Return for Follow up pending labs.  These notes generated with voice recognition software. I apologize for typographical errors.  David Graham  [facility] [address]  [address] [place], [place] [address] [phone]

## 2024-04-07 ENCOUNTER — Ambulatory Visit: Admitting: Urology

## 2024-04-07 ENCOUNTER — Encounter: Payer: Self-pay | Admitting: Urology

## 2024-04-07 VITALS — BP 129/85 | HR 71 | Ht 71.0 in | Wt 211.0 lb

## 2024-04-07 DIAGNOSIS — R6882 Decreased libido: Secondary | ICD-10-CM | POA: Diagnosis not present

## 2024-04-07 DIAGNOSIS — F5232 Male orgasmic disorder: Secondary | ICD-10-CM

## 2024-04-07 DIAGNOSIS — N529 Male erectile dysfunction, unspecified: Secondary | ICD-10-CM

## 2024-04-07 NOTE — Patient Instructions (Addendum)
 Maintain a healthy weight and lose weight if needed Maintaining a healthy weight is crucial for testosterone  levels. Excess body fat, particularly around the abdomen, can contribute to lower testosterone . Losing weight through a balanced diet and regular exercise can help boost testosterone  production up to 30%, according to some research.  Exercise Regular physical activity is one of the best ways to maintain healthy testosterone  levels. Both resistance training like weightlifting, and cardiovascular exercises such as running or swimming, can increase testosterone .  The largest improvements are seen with moderate- or high-intensity resistance exercises involving large muscle groups, such as squats and bench presses.  Diet. Well-rounded diets full of healthy fats, proteins, and nutrient-dense fruits and vegetables support healthy testosterone  production. Foods that have been linked to increased testosterone  include onions, oysters, fatty fish (which contains healthy omega-3 fats), and extra virgin olive oil. Alcohol. Excessive alcohol consumption can lower testosterone  levels, impair sexual function, and reduce sperm count due to hormone disruption. Reduce or avoid alcohol, or at least limit your intake to no more than one drink per day. Smoking. Nicotine found in cigarettes and chewing tobacco has been linked to lower testosterone  levels, though some studies suggest smoking may not affect or could even increase these levels. However, smoking can negatively affect male reproductive health, including sperm count, motility, and morphology. Quitting smoking may help some men restore a normal testosterone  level. Environmental toxins. Chemicals found in certain plastics, including bisphenol A or BPA, disrupt hormone-producing glands and have been associated with decreased testosterone  levels and reduced sperm count. Try to avoid BPA-containing products such as plastic bottles, containers, and certain canned  foods. Instead, look for BPA-free labels.  Adequate sleep is essential for maintaining testosterone  levels. Most testosterone  release occurs during sleep, particularly during the rapid eye movement (REM) stage.  Sleep apnea, a condition in which you stop breathing for short periods while you are asleep, can negatively affect hormone levels, including testosterone .  Aim for seven to nine hours of quality sleep per night to support healthy hormone production. If you have sleep apnea or are concerned you may have symptoms of this condition, talk with your doctor about treatment options.  DHEA is a supplement that may increase testosterone  levels

## 2024-04-08 ENCOUNTER — Ambulatory Visit: Payer: Self-pay | Admitting: Urology

## 2024-04-08 LAB — TESTOSTERONE: Testosterone: 513 ng/dL (ref 264–916)

## 2024-04-15 ENCOUNTER — Ambulatory Visit: Admitting: Urology

## 2024-04-29 ENCOUNTER — Other Ambulatory Visit: Payer: Self-pay | Admitting: Family Medicine

## 2024-04-29 ENCOUNTER — Encounter: Payer: Self-pay | Admitting: Family Medicine

## 2024-04-29 DIAGNOSIS — E669 Obesity, unspecified: Secondary | ICD-10-CM

## 2024-04-29 MED ORDER — SEMAGLUTIDE-WEIGHT MANAGEMENT 0.5 MG/0.5ML ~~LOC~~ SOAJ
0.5000 mg | SUBCUTANEOUS | 2 refills | Status: DC
Start: 2024-04-29 — End: 2024-07-27

## 2024-04-29 NOTE — Telephone Encounter (Signed)
 Please review

## 2024-05-11 ENCOUNTER — Ambulatory Visit: Admitting: Family Medicine

## 2024-05-21 ENCOUNTER — Ambulatory Visit: Admitting: Family Medicine

## 2024-06-05 ENCOUNTER — Ambulatory Visit: Admitting: Family Medicine

## 2024-06-23 ENCOUNTER — Other Ambulatory Visit: Payer: Self-pay

## 2024-06-23 MED ORDER — ROSUVASTATIN CALCIUM 5 MG PO TABS: 5.0000 mg | ORAL_TABLET | Freq: Every day | ORAL | 0 refills | Status: DC

## 2024-06-24 ENCOUNTER — Ambulatory Visit: Payer: Self-pay | Admitting: Urology

## 2024-06-25 ENCOUNTER — Ambulatory Visit: Admitting: Urology

## 2024-06-25 VITALS — BP 136/87 | HR 54 | Wt 200.0 lb

## 2024-06-25 DIAGNOSIS — N529 Male erectile dysfunction, unspecified: Secondary | ICD-10-CM

## 2024-06-25 DIAGNOSIS — N522 Drug-induced erectile dysfunction: Secondary | ICD-10-CM | POA: Diagnosis not present

## 2024-06-25 MED ORDER — TADALAFIL 5 MG PO TABS
5.0000 mg | ORAL_TABLET | Freq: Every day | ORAL | 3 refills | Status: AC | PRN
Start: 2024-06-25 — End: ?

## 2024-06-25 NOTE — Progress Notes (Signed)
   06/25/2024 8:34 AM   David Graham 02-Jan-1986 [id number]  Reason for visit: Follow up ED, fatigue  History: Originally seen in October 2022 for ejaculatory concerns and ED Has had good results on Cialis  5 mg daily Orgasm issues related to anxiety/psych meds Multiple normal testosterone  values, including 513 recently  Physical Exam: BP 136/87 (BP Location: Left Arm, Patient Position: Sitting, Cuff Size: Large)   Pulse (!) 54   Wt 200 lb (90.7 kg)   SpO2 100%   BMI 27.89 kg/m    Imaging/labs: July 2025 testosterone  513  Today: Overall doing well, ED well-controlled on Cialis  5 mg daily Continues to report fatigue, reassurance provided this is not related to testosterone .  Likely related to some of his other medications, potentially propranolol  Plan:   ED: Continue Cialis  5 mg daily, refilled Fatigue: Not related to any urologic issue, testosterone  levels have been normal, reassurance provided He would like to continue yearly urology follow-up   David JAYSON Burnet, MD  [facility] [address], [address] [place], [place] [address] [phone]

## 2024-07-21 ENCOUNTER — Encounter: Payer: Self-pay | Admitting: Family Medicine

## 2024-07-21 NOTE — Telephone Encounter (Signed)
 Called and spoke with patient. Gave samples to get him through the next 2 weeks until he can get his next refill. Told him to keep his appt on 11/10 for a weight follow up. He verbalized understanding.

## 2024-07-27 ENCOUNTER — Encounter: Payer: Self-pay | Admitting: Family Medicine

## 2024-07-27 ENCOUNTER — Ambulatory Visit: Admitting: Family Medicine

## 2024-07-27 DIAGNOSIS — E669 Obesity, unspecified: Secondary | ICD-10-CM | POA: Diagnosis not present

## 2024-07-27 MED ORDER — SEMAGLUTIDE-WEIGHT MANAGEMENT 0.5 MG/0.5ML ~~LOC~~ SOAJ: 0.5000 mg | SUBCUTANEOUS | 2 refills | Status: DC

## 2024-07-27 NOTE — Progress Notes (Signed)
 Established Patient Office Visit  Patient ID: David Graham, male    DOB: 15-Jun-1986  Age: 38 y.o. MRN: [id number] PCP: Elward Nocera K, MD  Chief Complaint  Patient presents with   Weight Check    Subjective:     HPI  Discussed the use of AI scribe software for clinical note transcription with the patient, who gave verbal consent to proceed.  History of Present Illness David Graham is a 38 year old male who presents for a consultation regarding his Wegovy  medication dosage.  He is using Wegovy  for weight loss and has successfully lost 32 pounds. He is contemplating increasing the dose but is concerned about potential side effects, such as muscle wastage. He has not been using protein shakes to mitigate this risk. He is also uncertain about his insurance coverage for the medication and has no refills left. He is satisfied with his current weight and does not wish to lose more, but is concerned about the possibility of regaining weight, particularly due to knee issues that have improved with weight loss. He is considering the duration of his Wegovy  use and is contemplating lifestyle changes to maintain his weight.  He is managing anxiety and depression with Remeron , having a history of sensitivity to SSRIs. He previously took Zoloft , which he discontinued. Remeron  helps with anxiety, depression, and sleep, despite causing weight gain. He has tried various medications and has undergone gene testing to find effective treatment. He is currently seeing a PA at [facility] in [place] and has seen multiple providers in the past.  He is concerned about the long-term use of Wegovy  and its side effects, although he has not experienced any adverse effects so far. He is also considering the impact of Remeron  on his weight and is exploring alternative medications with his psychiatrist.      Review of Systems  All other systems reviewed and are negative.     Objective:      BP 108/80   Pulse 69   Ht 5' 11 (1.803 m)   Wt 196 lb (88.9 kg)   SpO2 97%   BMI 27.34 kg/m     Physical Exam Vitals and nursing note reviewed.  Constitutional:      Appearance: Normal appearance.  HENT:     Head: Normocephalic.     Right Ear: External ear normal.     Left Ear: External ear normal.  Eyes:     Conjunctiva/sclera: Conjunctivae normal.  Cardiovascular:     Rate and Rhythm: Normal rate.  Pulmonary:     Effort: Pulmonary effort is normal. No respiratory distress.  Abdominal:     Palpations: Abdomen is soft.  Musculoskeletal:        General: Normal range of motion.  Skin:    General: Skin is warm.  Neurological:     Mental Status: He is alert and oriented to person, place, and time.  Psychiatric:        Mood and Affect: Mood normal.     Physical Exam     No results found for any visits on 07/27/24.     The ASCVD Risk score (Arnett DK, et al., 2019) failed to calculate for the following reasons:   The 2019 ASCVD risk score is only valid for ages 39 to 48    Assessment & Plan:   Problem List Items Addressed This Visit   None Visit Diagnoses       Obesity (BMI 30-39.9)  Relevant Medications   semaglutide -weight management (WEGOVY ) 0.5 MG/0.5ML SOAJ SQ injection       Assessment and Plan Assessment & Plan Obesity 32-pound weight loss with Wegovy . Satisfied with current weight. Discussed potential side effects of increasing dose. No current side effects. Insurance coverage uncertain. Prefers current dose. Advised on lifestyle changes to maintain weight loss. Hesitant to discontinue due to weight regain and knee issues. - Continue current dose of Wegovy . - Prescribed three refills of Wegovy . - Follow-up in three months to reassess weight management and medication tolerance. - Encouraged lifestyle modifications, including diet and exercise, to maintain weight loss. - Discussed potential side effects of Wegovy , including severe  heartburn, indigestion, diarrhea, and bowel obstruction. - Advised to discuss with psychiatrist about potential medication changes, to manage anxiety and depression without causing weight gain.    Return in about 3 months (around 10/27/2024) for chronic follow up with PCP.    Vinary K Azaan Leask, MD [facility] at [facility]

## 2024-08-06 ENCOUNTER — Encounter: Payer: Self-pay | Admitting: Family Medicine

## 2024-08-06 ENCOUNTER — Other Ambulatory Visit: Payer: Self-pay | Admitting: Family Medicine

## 2024-08-06 DIAGNOSIS — E669 Obesity, unspecified: Secondary | ICD-10-CM

## 2024-08-06 MED ORDER — SEMAGLUTIDE-WEIGHT MANAGEMENT 1 MG/0.5ML ~~LOC~~ SOAJ: 1.0000 mg | SUBCUTANEOUS | 2 refills | Status: DC

## 2024-08-06 NOTE — Telephone Encounter (Signed)
 Please review patient's message:

## 2024-08-07 ENCOUNTER — Telehealth: Payer: Self-pay | Admitting: Pharmacy Technician

## 2024-08-07 ENCOUNTER — Other Ambulatory Visit (HOSPITAL_COMMUNITY): Payer: Self-pay

## 2024-08-07 NOTE — Telephone Encounter (Signed)
 Pharmacy Patient Advocate Encounter  Received notification from [facility] that Prior Authorization for Wegovy  1MG /0.5ML auto-injectors has been APPROVED from 08/07/24 to 08/07/25     PA #/Case ID/Reference #: [id number]

## 2024-08-07 NOTE — Telephone Encounter (Signed)
 You're welcome!

## 2024-08-07 NOTE — Telephone Encounter (Signed)
 Hi Kiana, Could you please follow up. Thanks

## 2024-08-07 NOTE — Telephone Encounter (Signed)
 Thank you :)

## 2024-08-07 NOTE — Telephone Encounter (Signed)
 PA request has been Started. New Encounter has been or will be created for follow up. For additional info see Pharmacy Prior Auth telephone encounter from 08/07/24.

## 2024-08-07 NOTE — Telephone Encounter (Signed)
 Pharmacy Patient Advocate Encounter   Received notification from Patient Advice Request messages that prior authorization for Wegovy  1 mg/0.5ml is required/requested.   Insurance verification completed.   The patient is insured through [employer] [employer]   Per test claim: PA required; PA started via CoverMyMeds. KEY [id number] . Waiting for clinical questions to populate.

## 2024-08-08 NOTE — Telephone Encounter (Signed)
 Requested medication (s) are due for refill today: Yes  Requested medication (s) are on the active medication list: Yes  Last refill:  08/06/24  Future visit scheduled: Yes  Notes to clinic:  Unable to refill per protocol due to pharmacy request for an alternative medication.      Requested Prescriptions  Pending Prescriptions Disp Refills   WEGOVY  1 MG/0.5ML SOAJ SQ injection [Pharmacy Med Name: WEGOVY  1 MG/0.5 ML PEN]  2    Sig: INJECT 1MG  INTO THE SKIN ONCE A WEEK     Endocrinology:  Diabetes - GLP-1 Receptor Agonists - semaglutide  Failed - 08/08/2024 11:52 AM      Failed - HBA1C in normal range and within 180 days    No results found for: HGBA1C, LABA1C       Passed - Cr in normal range and within 360 days    Creatinine  Date Value Ref Range Status  09/25/2013 1.10 0.60 - 1.30 mg/dL Final   Creatinine, Ser  Date Value Ref Range Status  03/27/2024 1.05 0.76 - 1.27 mg/dL Final         Passed - Valid encounter within last 6 months    Recent Outpatient Visits           1 week ago Obesity (BMI 30-39.9)   [place] [facility] at [facility], Vinay K, MD   4 months ago Primary hypertension   [facility] at [facility], Vinay K, MD   5 months ago Obesity (BMI 30-39.9)   [place] [facility] at [facility], Vinay K, MD   5 months ago Obesity (BMI 30-39.9)   [place] [facility] at [facility], Vinay K, MD       Future Appointments             In 10 months McGowan, Clotilda [name] [facility] [place]

## 2024-08-10 NOTE — Telephone Encounter (Signed)
 PA completed.

## 2024-09-26 ENCOUNTER — Other Ambulatory Visit: Payer: Self-pay | Admitting: Family Medicine

## 2024-09-28 NOTE — Telephone Encounter (Signed)
 Requested Prescriptions  Pending Prescriptions Disp Refills   rosuvastatin  (CRESTOR ) 5 MG tablet [Pharmacy Med Name: ROSUVASTATIN  CALCIUM  5 MG TAB] 90 tablet 1    Sig: TAKE 1 TABLET (5 MG TOTAL) BY MOUTH DAILY.     Cardiovascular:  Antilipid - Statins 2 Failed - 09/28/2024  2:54 PM      Failed - Lipid Panel in normal range within the last 12 months    Cholesterol, Total  Date Value Ref Range Status  03/27/2024 224 (H) 100 - 199 mg/dL Final   LDL Chol Calc (NIH)  Date Value Ref Range Status  03/27/2024 155 (H) 0 - 99 mg/dL Final   HDL  Date Value Ref Range Status  03/27/2024 50 >39 mg/dL Final   Triglycerides  Date Value Ref Range Status  03/27/2024 104 0 - 149 mg/dL Final         Passed - Cr in normal range and within 360 days    Creatinine  Date Value Ref Range Status  09/25/2013 1.10 0.60 - 1.30 mg/dL Final   Creatinine, Ser  Date Value Ref Range Status  03/27/2024 1.05 0.76 - 1.27 mg/dL Final         Passed - Patient is not pregnant      Passed - Valid encounter within last 12 months    Recent Outpatient Visits           2 months ago Obesity (BMI 30-39.9)   [place] [facility] at [facility], Vinay K, MD   6 months ago Primary hypertension   [facility] at [facility], Vinay K, MD   6 months ago Obesity (BMI 30-39.9)   [place] [facility] at [facility], Vinay K, MD   7 months ago Obesity (BMI 30-39.9)   [place] [facility] at [facility], Vinay K, MD       Future Appointments             In 9 months Graham, David [name] [facility] [place]

## 2024-10-07 ENCOUNTER — Encounter: Payer: Self-pay | Admitting: Family Medicine

## 2024-10-07 NOTE — Telephone Encounter (Signed)
 Please review and advise. LOV 07/21/24

## 2024-10-08 ENCOUNTER — Other Ambulatory Visit: Payer: Self-pay | Admitting: Family Medicine

## 2024-10-08 DIAGNOSIS — E669 Obesity, unspecified: Secondary | ICD-10-CM

## 2024-10-08 MED ORDER — SEMAGLUTIDE-WEIGHT MANAGEMENT 1 MG/0.5ML ~~LOC~~ SOAJ: 1.0000 mg | SUBCUTANEOUS | 2 refills | Status: AC

## 2024-10-08 NOTE — Telephone Encounter (Signed)
 Error

## 2024-10-19 ENCOUNTER — Ambulatory Visit: Admitting: Family Medicine

## 2024-10-26 ENCOUNTER — Ambulatory Visit: Admitting: Family Medicine

## 2025-01-15 ENCOUNTER — Ambulatory Visit: Admitting: Family Medicine

## 2025-06-28 ENCOUNTER — Ambulatory Visit: Admitting: Urology
